# Patient Record
Sex: Male | Born: 1975 | Race: White | Hispanic: No | Marital: Single | Smoking: Current every day smoker
Health system: Southern US, Community
[De-identification: ages and names within clinical notes are randomized; demographics above are authoritative.]

## PROBLEM LIST (undated history)

## (undated) DIAGNOSIS — M1990 Unspecified osteoarthritis, unspecified site: Secondary | ICD-10-CM

## (undated) DIAGNOSIS — E119 Type 2 diabetes mellitus without complications: Secondary | ICD-10-CM

## (undated) HISTORY — DX: Type 2 diabetes mellitus without complications: E11.9

## (undated) HISTORY — DX: Unspecified osteoarthritis, unspecified site: M19.90

---

## 2005-07-31 ENCOUNTER — Emergency Department: Payer: Self-pay | Admitting: Unknown Physician Specialty

## 2009-11-07 ENCOUNTER — Ambulatory Visit: Payer: Self-pay | Admitting: Internal Medicine

## 2009-12-13 ENCOUNTER — Ambulatory Visit: Payer: Self-pay | Admitting: Family Medicine

## 2011-08-21 ENCOUNTER — Emergency Department: Payer: Self-pay | Admitting: Emergency Medicine

## 2011-08-21 LAB — COMPREHENSIVE METABOLIC PANEL
Albumin: 4.1 g/dL (ref 3.4–5.0)
Alkaline Phosphatase: 77 U/L (ref 50–136)
Anion Gap: 13 (ref 7–16)
Calcium, Total: 8.8 mg/dL (ref 8.5–10.1)
Chloride: 104 mmol/L (ref 98–107)
EGFR (African American): >60
EGFR (Non-African Amer.): >60
Glucose: 171 mg/dL — ABNORMAL HIGH (ref 65–99)
SGPT (ALT): 72 U/L

## 2011-08-21 LAB — CBC
MCHC: 34.7 g/dL (ref 32.0–36.0)
MCV: 91 fL (ref 80–100)
Platelet: 218 10*3/uL (ref 150–440)
RBC: 5.16 10*6/uL (ref 4.40–5.90)

## 2011-12-24 DIAGNOSIS — E119 Type 2 diabetes mellitus without complications: Secondary | ICD-10-CM | POA: Insufficient documentation

## 2013-05-27 DIAGNOSIS — E039 Hypothyroidism, unspecified: Secondary | ICD-10-CM | POA: Insufficient documentation

## 2013-09-16 DIAGNOSIS — L03116 Cellulitis of left lower limb: Secondary | ICD-10-CM | POA: Insufficient documentation

## 2013-09-18 ENCOUNTER — Observation Stay: Payer: Self-pay | Admitting: Internal Medicine

## 2013-09-18 LAB — CBC WITH DIFFERENTIAL/PLATELET
Basophil #: 0.1 10*3/uL (ref 0.0–0.1)
Basophil %: 0.6 %
Eosinophil #: 0.3 10*3/uL (ref 0.0–0.7)
Eosinophil %: 3.1 %
HCT: 46.4 % (ref 40.0–52.0)
HGB: 16.2 g/dL (ref 13.0–18.0)
LYMPHS PCT: 32 %
Lymphocyte #: 3.5 10*3/uL (ref 1.0–3.6)
MCH: 31.5 pg (ref 26.0–34.0)
MCHC: 34.8 g/dL (ref 32.0–36.0)
MCV: 90 fL (ref 80–100)
MONO ABS: 0.6 x10 3/mm (ref 0.2–1.0)
Monocyte %: 5.9 %
Neutrophil #: 6.4 10*3/uL (ref 1.4–6.5)
Neutrophil %: 58.4 %
Platelet: 257 10*3/uL (ref 150–440)
RBC: 5.14 10*6/uL (ref 4.40–5.90)
RDW: 13 % (ref 11.5–14.5)
WBC: 10.9 10*3/uL — ABNORMAL HIGH (ref 3.8–10.6)

## 2013-09-18 LAB — BASIC METABOLIC PANEL
Anion Gap: 9 (ref 7–16)
BUN: 17 mg/dL (ref 7–18)
CALCIUM: 9.5 mg/dL (ref 8.5–10.1)
CO2: 25 mmol/L (ref 21–32)
Chloride: 104 mmol/L (ref 98–107)
Creatinine: 0.95 mg/dL (ref 0.60–1.30)
EGFR (Non-African Amer.): >60
Glucose: 130 mg/dL — ABNORMAL HIGH (ref 65–99)
OSMOLALITY: 279 (ref 275–301)
POTASSIUM: 3.8 mmol/L (ref 3.5–5.1)
SODIUM: 138 mmol/L (ref 136–145)

## 2013-09-23 LAB — CULTURE, BLOOD (SINGLE)

## 2014-05-09 ENCOUNTER — Emergency Department: Payer: Self-pay | Admitting: Emergency Medicine

## 2014-05-09 LAB — CBC WITH DIFFERENTIAL/PLATELET
BASOS ABS: 0.1 10*3/uL (ref 0.0–0.1)
BASOS PCT: 0.7 %
EOS ABS: 0.3 10*3/uL (ref 0.0–0.7)
Eosinophil %: 2.5 %
HCT: 45.9 % (ref 40.0–52.0)
HGB: 16 g/dL (ref 13.0–18.0)
Lymphocyte #: 2.4 10*3/uL (ref 1.0–3.6)
Lymphocyte %: 18.8 %
MCH: 31.5 pg (ref 26.0–34.0)
MCHC: 34.8 g/dL (ref 32.0–36.0)
MCV: 91 fL (ref 80–100)
MONO ABS: 0.8 x10 3/mm (ref 0.2–1.0)
Monocyte %: 6.2 %
NEUTROS PCT: 71.8 %
Neutrophil #: 9.2 10*3/uL — ABNORMAL HIGH (ref 1.4–6.5)
PLATELETS: 211 10*3/uL (ref 150–440)
RBC: 5.07 10*6/uL (ref 4.40–5.90)
RDW: 12.4 % (ref 11.5–14.5)
WBC: 12.8 10*3/uL — AB (ref 3.8–10.6)

## 2014-05-09 LAB — COMPREHENSIVE METABOLIC PANEL
Albumin: 3.6 g/dL (ref 3.4–5.0)
Alkaline Phosphatase: 83 U/L
Anion Gap: 8 (ref 7–16)
BUN: 20 mg/dL — AB (ref 7–18)
Bilirubin,Total: 0.5 mg/dL (ref 0.2–1.0)
CO2: 24 mmol/L (ref 21–32)
CREATININE: 1.12 mg/dL (ref 0.60–1.30)
Calcium, Total: 8.3 mg/dL — ABNORMAL LOW (ref 8.5–10.1)
Chloride: 105 mmol/L (ref 98–107)
EGFR (African American): >60
EGFR (Non-African Amer.): >60
Glucose: 196 mg/dL — ABNORMAL HIGH (ref 65–99)
OSMOLALITY: 282 (ref 275–301)
Potassium: 4.1 mmol/L (ref 3.5–5.1)
SGOT(AST): 38 U/L — ABNORMAL HIGH (ref 15–37)
SGPT (ALT): 51 U/L
Sodium: 137 mmol/L (ref 136–145)
Total Protein: 7.6 g/dL (ref 6.4–8.2)

## 2014-05-15 LAB — CULTURE, BLOOD (SINGLE)

## 2014-09-11 NOTE — H&P (Signed)
PATIENT NAME:  Thomas Benton, Thomas Benton DATE OF BIRTH:  December 31, 1975  DATE OF ADMISSION:  09/18/2013  PRIMARY CARE PHYSICIAN:  Dr. Delton PrairiePaul Benton.   REFERRING PHYSICIAN:  Dr. Chiquita LothJade Benton.   CHIEF COMPLAINT:  Left third toe ulcer, pain, drainage.   HISTORY OF PRESENT ILLNESS:  Mr. Thomas Benton is a 39 year old morbidly obese white male with a BMI of 42, has history of diabetes mellitus, on oral medication with metformin who comes to the Emergency Department for two days of pain and swelling in the third left toe.  Denies having any trauma.  Denies wearing tight shoes.  Two days back noticed to have some fever of 100.8; however, did not have any fever in the last two days.  Also noticed to have some drainage.  Concerning about worsening of the pain came to the Emergency Department.  X-ray of the left foot does not show any osteomyelitis.  CBC shows mild elevation of the WBC, but no left shift.  Blood glucose is 130.  The patient states usually blood sugars run around over 200.   PAST MEDICAL HISTORY: 1.  Hypothyroidism.  2.  Diabetes mellitus.   ALLERGIES:  No known drug allergies.   HOME MEDICATIONS: 1.  Zofran 4 mg every 4 to 6 hours as needed.  2.  Synthroid 175 mcg once a day.  3.  Metformin 750 mg once a day.  4.  Antivert 25 mg 4 to 6 hours as needed.   SOCIAL HISTORY:  Smokes 1 pack a day.  Denies drinking alcohol or using illicit drugs.  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   FAMILY HISTORY:  Diabetes mellitus.   REVIEW OF SYSTEMS:  CONSTITUTIONAL:  Denies any generalized weakness.  EYES:  No change in vision.  EARS, NOSE, THROAT:  No change in hearing.  RESPIRATORY:  No cough, shortness of breath.   CARDIOVASCULAR:  No chest pain, palpitations.  GASTROINTESTINAL:  No nausea, vomiting, abdominal pain.  GENITOURINARY:  No dysuria or hematuria.   HEMATOLOGY:  No easy bruising or bleeding.  ENDOCRINE:  No polyuria or polydipsia; however, the patient has diabetes mellitus and  hypothyroidism.  MUSCULOSKELETAL:  No joint pains and aches.  NEUROLOGIC:  No weakness or numbness in any part of the body.   PHYSICAL EXAMINATION: GENERAL:  This is a well-built, well-nourished morbidly obese male down in the bed, not in distress.  VITAL SIGNS:  Temperature 98, pulse 68, blood pressure 130/68, respiratory rate of 16, oxygen saturation 96% on room air.  HEENT:  Head normocephalic, atraumatic.  Eyes, no scleral icterus.  Conjunctivae normal.  Pupils equal and react to light.  Extraocular movements are intact.  Mucous membranes moist.  No pharyngeal erythema.  NECK:  Supple.  No lymphadenopathy.  No JVD.  No carotid bruit.  CHEST:  Has no focal tenderness.  LUNGS:  Bilaterally clear to auscultation.  HEART:  S1, S2 regular.  No murmurs are heard.  ABDOMEN:  Bowel sounds plus.  Soft, nontender, nondistended.  No hepatosplenomegaly. EXTREMITIES:  Left third foot has significant redness and swelling of the left third toe.  Tenderness to palpation.  Pulses 2+.   MUSCULOSKELETAL:  Good range of motion in all the extremities.  SKIN:  No rash or lesions.  NEUROLOGIC:  The patient is alert, oriented to place, person, and time.  Cranial nerves II through XII intact.  Motor 5 by 5 in upper and lower extremities.   LABORATORY DATA:  CBC is completely within normal limits.  CMP is completely within normal limits.  X-ray of the left foot shows no evidence of osseous erosion to suggest osteomyelitis, soft tissue swelling.   ASSESSMENT AND PLAN:  Mr. Thomas Benton is a 39 year old male who comes to the Emergency Department left third toe wound.  1.  Left foot diabetic ulcer.  We will obtain MRA of the foot.  If positive for osteomyelitis we will consult orthopedic surgery for bone biopsy and cultures.  We will hold the antibiotics for now as the patient does not have any fever or elevated white blood cell count.  2.  Diabetes mellitus, poorly controlled.  We will obtain hemoglobin A1c.  We will hold  the metformin for now.  Continue with sliding scale insulin.  3.  Morbid obesity.  Counseled with the patient.  4.  Tobacco use:  Counseled with the patient.  If the patient is anxious we will consider starting on nicotine patch.   5.  Hypothyroidism.  Continue Synthroid.  6.  Keep the patient on deep vein thrombosis prophylaxis with Lovenox.   TIME SPENT:  45 minutes.    ____________________________ Thomas Griffins, MD pv:ea D: 09/18/2013 02:25:36 ET T: 09/18/2013 03:27:57 ET JOB#: [id number]  cc: Thomas Griffins, MD, <Dictator> Vic Ripper. Mariana Kaufman, MD Clerance Lav Quintel Mccalla MD ELECTRONICALLY SIGNED 09/19/2013 1:03

## 2014-09-11 NOTE — Discharge Summary (Signed)
PATIENT NAME:  Thomas Benton, Thomas MR#:  [id number] DATE OF BIRTH:  1976/01/24  DATE OF ADMISSION:  09/18/2013 DATE OF DISCHARGE:  09/18/2013  ADMITTING DIAGNOSIS:  Left third toe pain, erythema, some drainage.   DISCHARGE DIAGNOSES:  1.  Left third toe ulcer with some pain and drainage due to acute cellulitis without evidence of abscess or osteomyelitis.  2.  Hypothyroidism.  3.  Diabetes with poor control.  4.  Nicotine addiction.   CONSULTANTS:  None.   PERTINENT LABORATORIES AND EVALUATIONS:  MRI of the left foot showed negative for osteomyelitis, cellulitis of the third toe, no soft tissue abscess. Blood culture shows no growth x2. Glucose 130, BUN 17, creatinine 0.95, sodium 138, potassium 3.8, chloride 104, CO2 is 25, calcium 8.5. WBC 10.9, hemoglobin 16.2, platelet count was 257.   HOSPITAL COURSE:  Please refer to H and P done by the admitting physician. The patient is a 39 year old white male with history of poorly controlled diabetes, who presented with complaint of left third toe pain and drainage. The patient came to the ED, and he had a slightly elevated WBC count. There was concern that he may have osteomyelitis. He underwent an MRI of the foot, which was negative for any evidence of abscess or osteomyelitis. It was likely due to cellulitis. By the time I went to see him, he was very anxious to go home. At this time, I will discharge him with outpatient followup with podiatry.   DISCHARGE MEDICATIONS:  Synthroid 175 mcg daily, metformin 750 daily, aspirin 81 one tab p.o. daily, Bactrim 1 tab p.o. b.i.d. for 7 days, acetaminophen/oxycodone 325/5 one tab p.o. q.8 p.r.n. for pain, promethazine (Dictation Anomaly) p.r.n. for nausea.   DIET:  Low fat, low cholesterol, carbohydrate controlled diet.   ACTIVITY:  As tolerated.   FOLLOWUP:  With primary M.D. in 1 to 2 weeks. [facility] 1 to 2 weeks.  (Dictation Anomaly)  TIME SPENT ON THIS DISCHARGE:  45 minutes.     ____________________________ Lacie ScottsShreyang H. Allena KatzPatel, MD shp:ms D: 09/18/2013 18:39:55 ET T: 09/18/2013 20:37:24 ET JOB#: [id number]  cc: Ram Haugan H. Allena KatzPatel, MD, <Dictator> Charise CarwinSHREYANG H Jeronda Don MD ELECTRONICALLY SIGNED 09/23/2013 16:16

## 2018-03-17 DIAGNOSIS — M1711 Unilateral primary osteoarthritis, right knee: Secondary | ICD-10-CM | POA: Insufficient documentation

## 2018-03-17 DIAGNOSIS — Z6841 Body Mass Index (BMI) 40.0 and over, adult: Secondary | ICD-10-CM

## 2018-04-16 ENCOUNTER — Encounter: Payer: Self-pay | Admitting: Nurse Practitioner

## 2018-04-16 ENCOUNTER — Ambulatory Visit
Admission: RE | Admit: 2018-04-16 | Discharge: 2018-04-16 | Disposition: A | Discharge status: Home / Self Care | Payer: BLUE CROSS/BLUE SHIELD | Source: Ambulatory Visit | Attending: Nurse Practitioner | Admitting: Nurse Practitioner

## 2018-04-16 ENCOUNTER — Ambulatory Visit: Payer: BLUE CROSS/BLUE SHIELD | Admitting: Nurse Practitioner

## 2018-04-16 ENCOUNTER — Other Ambulatory Visit: Payer: Self-pay

## 2018-04-16 VITALS — BP 132/76 | HR 87 | Temp 98.6°F | Ht 68.0 in | Wt 285.0 lb

## 2018-04-16 DIAGNOSIS — G8929 Other chronic pain: Secondary | ICD-10-CM

## 2018-04-16 DIAGNOSIS — M79641 Pain in right hand: Secondary | ICD-10-CM

## 2018-04-16 DIAGNOSIS — M25511 Pain in right shoulder: Principal | ICD-10-CM

## 2018-04-16 DIAGNOSIS — Z7984 Long term (current) use of oral hypoglycemic drugs: Secondary | ICD-10-CM | POA: Insufficient documentation

## 2018-04-16 DIAGNOSIS — M25561 Pain in right knee: Secondary | ICD-10-CM

## 2018-04-16 DIAGNOSIS — Z789 Other specified health status: Secondary | ICD-10-CM | POA: Insufficient documentation

## 2018-04-16 DIAGNOSIS — E039 Hypothyroidism, unspecified: Secondary | ICD-10-CM | POA: Insufficient documentation

## 2018-04-16 DIAGNOSIS — M79605 Pain in left leg: Secondary | ICD-10-CM

## 2018-04-16 DIAGNOSIS — M899 Disorder of bone, unspecified: Secondary | ICD-10-CM

## 2018-04-16 DIAGNOSIS — M1711 Unilateral primary osteoarthritis, right knee: Secondary | ICD-10-CM | POA: Insufficient documentation

## 2018-04-16 DIAGNOSIS — Z6841 Body Mass Index (BMI) 40.0 and over, adult: Secondary | ICD-10-CM

## 2018-04-16 DIAGNOSIS — G894 Chronic pain syndrome: Secondary | ICD-10-CM | POA: Insufficient documentation

## 2018-04-16 DIAGNOSIS — F17210 Nicotine dependence, cigarettes, uncomplicated: Secondary | ICD-10-CM | POA: Insufficient documentation

## 2018-04-16 DIAGNOSIS — M79604 Pain in right leg: Secondary | ICD-10-CM | POA: Insufficient documentation

## 2018-04-16 DIAGNOSIS — E1140 Type 2 diabetes mellitus with diabetic neuropathy, unspecified: Secondary | ICD-10-CM | POA: Insufficient documentation

## 2018-04-16 DIAGNOSIS — Z79899 Other long term (current) drug therapy: Secondary | ICD-10-CM

## 2018-04-16 NOTE — Patient Instructions (Addendum)
____________________________________________________________________________________________  Appointment Policy Summary  It is our goal and responsibility to provide the medical community with assistance in the evaluation and management of patients with chronic pain. Unfortunately our resources are limited. Because we do not have an unlimited amount of time, or available appointments, we are required to closely monitor and manage their use. The following rules exist to maximize their use:  Patient's responsibilities: 1. Punctuality:  At what time should I arrive? You should be physically present in our office 30 minutes before your scheduled appointment. Your scheduled appointment is with your assigned healthcare provider. However, it takes 5-10 minutes to be "checked-in", and another 15 minutes for the nurses to do the admission. If you arrive to our office at the time you were given for your appointment, you will end up being at least 20-25 minutes late to your appointment with the provider. 2. Tardiness:  What happens if I arrive only a few minutes after my scheduled appointment time? You will need to reschedule your appointment. The cutoff is your appointment time. This is why it is so important that you arrive at least 30 minutes before that appointment. If you have an appointment scheduled for 10:00 AM and you arrive at 10:01, you will be required to reschedule your appointment.  3. Plan ahead:  Always assume that you will encounter traffic on your way in. Plan for it. If you are dependent on a driver, make sure they understand these rules and the need to arrive early. 4. Other appointments and responsibilities:  Avoid scheduling any other appointments before or after your pain clinic appointments.  5. Be prepared:  Write down everything that you need to discuss with your healthcare provider and give this information to the admitting nurse. Write down the medications that you will need  refilled. Bring your pills and bottles (even the empty ones), to all of your appointments, except for those where a procedure is scheduled. 6. No children or pets:  Find someone to take care of them. It is not appropriate to bring them in. 7. Scheduling changes:  We request "advanced notification" of any changes or cancellations. 8. Advanced notification:  Defined as a time period of more than 24 hours prior to the originally scheduled appointment. This allows for the appointment to be offered to other patients. 9. Rescheduling:  When a visit is rescheduled, it will require the cancellation of the original appointment. For this reason they both fall within the category of "Cancellations".  10. Cancellations:  They require advanced notification. Any cancellation less than 24 hours before the  appointment will be recorded as a "No Show". 11. No Show:  Defined as an unkept appointment where the patient failed to notify or declare to the practice their intention or inability to keep the appointment.  Corrective process for repeat offenders:  1. Tardiness: Three (3) episodes of rescheduling due to late arrivals will be recorded as one (1) "No Show". 2. Cancellation or reschedule: Three (3) cancellations or rescheduling will be recorded as one (1) "No Show". 3. "No Shows": Three (3) "No Shows" within a 12 month period will result in discharge from the practice. ____________________________________________________________________________________________   ______________________________________________________________________________________________  Specialty Pain Scale  Introduction:  There are significant differences in how pain is reported. The word pain usually refers to physical pain, but it is also a common synonym of suffering. The medical community uses a scale from 0 (zero) to 10 (ten) to report pain level. Zero (0) is described as "no pain",   while ten (10) is described as "the worse pain  you can imagine". The problem with this scale is that physical pain is reported along with suffering. Suffering refers to mental pain, or more often yet it refers to any unpleasant feeling, emotion or aversion associated with the perception of harm or threat of harm. It is the psychological component of pain.  Pain Specialists prefer to separate the two components. The pain scale used by this practice is the Verbal Numerical Rating Scale (VNRS-11). This scale is for the physical pain only. DO NOT INCLUDE how your pain psychologically affects you. This scale is for adults 21 years of age and older. It has 11 (eleven) levels. The 1st level is 0/10. This means: "right now, I have no pain". In the context of pain management, it also means: "right now, my physical pain is under control with the current therapy".  General Information:  The scale should reflect your current level of pain. Unless you are specifically asked for the level of your worst pain, or your average pain. If you are asked for one of these two, then it should be understood that it is over the past 24 hours.  Levels 1 (one) through 5 (five) are described below, and can be treated as an outpatient. Ambulatory pain management facilities such as ours are more than adequate to treat these levels. Levels 6 (six) through 10 (ten) are also described below, however, these must be treated as a hospitalized patient. While levels 6 (six) and 7 (seven) may be evaluated at an urgent care facility, levels 8 (eight) through 10 (ten) constitute medical emergencies and as such, they belong in a hospital's emergency department. When having these levels (as described below), do not come to our office. Our facility is not equipped to manage these levels. Go directly to an urgent care facility or an emergency department to be evaluated.  Definitions:  Activities of Daily Living (ADL): Activities of daily living (ADL or ADLs) is a term used in healthcare to refer to  people's daily self-care activities. Health professionals often use a person's ability or inability to perform ADLs as a measurement of their functional status, particularly in regard to people post injury, with disabilities and the elderly. There are two ADL levels: Basic and Instrumental. Basic Activities of Daily Living (BADL  or BADLs) consist of self-care tasks that include: Bathing and showering; personal hygiene and grooming (including brushing/combing/styling hair); dressing; Toilet hygiene (getting to the toilet, cleaning oneself, and getting back up); eating and self-feeding (not including cooking or chewing and swallowing); functional mobility, often referred to as "transferring", as measured by the ability to walk, get in and out of bed, and get into and out of a chair; the broader definition (moving from one place to another while performing activities) is useful for people with different physical abilities who are still able to get around independently. Basic ADLs include the things many people do when they get up in the morning and get ready to go out of the house: get out of bed, go to the toilet, bathe, dress, groom, and eat. On the average, loss of function typically follows a particular order. Hygiene is the first to go, followed by loss of toilet use and locomotion. The last to go is the ability to eat. When there is only one remaining area in which the person is independent, there is a 62.9% chance that it is eating and only a 3.5% chance that it is hygiene. Instrumental Activities   of Daily Living (IADL or IADLs) are not necessary for fundamental functioning, but they let an individual live independently in a community. IADL consist of tasks that include: cleaning and maintaining the house; home establishment and maintenance; care of others (including selecting and supervising caregivers); care of pets; child rearing; managing money; managing financials (investments, etc.); meal preparation  and cleanup; shopping for groceries and necessities; moving within the community; safety procedures and emergency responses; health management and maintenance (taking prescribed medications); and using the telephone or other form of communication.  Instructions:  Most patients tend to report their pain as a combination of two factors, their physical pain and their psychosocial pain. This last one is also known as "suffering" and it is reflection of how physical pain affects you socially and psychologically. From now on, report them separately.  From this point on, when asked to report your pain level, report only your physical pain. Use the following table for reference.  Pain Clinic Pain Levels (0-5/10)  Pain Level Score  Description  No Pain 0   Mild pain 1 Nagging, annoying, but does not interfere with basic activities of daily living (ADL). Patients are able to eat, bathe, get dressed, toileting (being able to get on and off the toilet and perform personal hygiene functions), transfer (move in and out of bed or a chair without assistance), and maintain continence (able to control bladder and bowel functions). Blood pressure and heart rate are unaffected. A normal heart rate for a healthy adult ranges from 60 to 100 bpm (beats per minute).   Mild to moderate pain 2 Noticeable and distracting. Impossible to hide from other people. More frequent flare-ups. Still possible to adapt and function close to normal. It can be very annoying and may have occasional stronger flare-ups. With discipline, patients may get used to it and adapt.   Moderate pain 3 Interferes significantly with activities of daily living (ADL). It becomes difficult to feed, bathe, get dressed, get on and off the toilet or to perform personal hygiene functions. Difficult to get in and out of bed or a chair without assistance. Very distracting. With effort, it can be ignored when deeply involved in activities.   Moderately severe pain  4 Impossible to ignore for more than a few minutes. With effort, patients may still be able to manage work or participate in some social activities. Very difficult to concentrate. Signs of autonomic nervous system discharge are evident: dilated pupils (mydriasis); mild sweating (diaphoresis); sleep interference. Heart rate becomes elevated (>115 bpm). Diastolic blood pressure (lower number) rises above 100 mmHg. Patients find relief in laying down and not moving.   Severe pain 5 Intense and extremely unpleasant. Associated with frowning face and frequent crying. Pain overwhelms the senses.  Ability to do any activity or maintain social relationships becomes significantly limited. Conversation becomes difficult. Pacing back and forth is common, as getting into a comfortable position is nearly impossible. Pain wakes you up from deep sleep. Physical signs will be obvious: pupillary dilation; increased sweating; goosebumps; brisk reflexes; cold, clammy hands and feet; nausea, vomiting or dry heaves; loss of appetite; significant sleep disturbance with inability to fall asleep or to remain asleep. When persistent, significant weight loss is observed due to the complete loss of appetite and sleep deprivation.  Blood pressure and heart rate becomes significantly elevated. Caution: If elevated blood pressure triggers a pounding headache associated with blurred vision, then the patient should immediately seek attention at an urgent or emergency care unit, as   these may be signs of an impending stroke.    Emergency Department Pain Levels (6-10/10)  Emergency Room Pain 6 Severely limiting. Requires emergency care and should not be seen or managed at an outpatient pain management facility. Communication becomes difficult and requires great effort. Assistance to reach the emergency department may be required. Facial flushing and profuse sweating along with potentially dangerous increases in heart rate and blood pressure  will be evident.   Distressing pain 7 Self-care is very difficult. Assistance is required to transport, or use restroom. Assistance to reach the emergency department will be required. Tasks requiring coordination, such as bathing and getting dressed become very difficult.   Disabling pain 8 Self-care is no longer possible. At this level, pain is disabling. The individual is unable to do even the most "basic" activities such as walking, eating, bathing, dressing, transferring to a bed, or toileting. Fine motor skills are lost. It is difficult to think clearly.   Incapacitating pain 9 Pain becomes incapacitating. Thought processing is no longer possible. Difficult to remember your own name. Control of movement and coordination are lost.   The worst pain imaginable 10 At this level, most patients pass out from pain. When this level is reached, collapse of the autonomic nervous system occurs, leading to a sudden drop in blood pressure and heart rate. This in turn results in a temporary and dramatic drop in blood flow to the brain, leading to a loss of consciousness. Fainting is one of the body's self defense mechanisms. Passing out puts the brain in a calmed state and causes it to shut down for a while, in order to begin the healing process.    Summary: 1. Refer to this scale when providing us with your pain level. 2. Be accurate and careful when reporting your pain level. This will help with your care. 3. Over-reporting your pain level will lead to loss of credibility. 4. Even a level of 1/10 means that there is pain and will be treated at our facility. 5. High, inaccurate reporting will be documented as "Symptom Exaggeration", leading to loss of credibility and suspicions of possible secondary gains such as obtaining more narcotics, or wanting to appear disabled, for fraudulent reasons. 6. Only pain levels of 5 or below will be seen at our facility. 7. Pain levels of 6 and above will be sent to the  Emergency Department and the appointment cancelled. ______________________________________________________________________________________________   BMI Assessment: Estimated body mass index is 43.33 kg/m as calculated from the following:   Height as of this encounter: 5\' 8"  (1.727 m).   Weight as of this encounter: 285 lb (129.3 kg).  BMI interpretation table: BMI level Category Range association with higher incidence of chronic pain  <18 kg/m2 Underweight   18.5-24.9 kg/m2 Ideal body weight   25-29.9 kg/m2 Overweight Increased incidence by 20%  30-34.9 kg/m2 Obese (Class I) Increased incidence by 68%  35-39.9 kg/m2 Severe obesity (Class II) Increased incidence by 136%  >40 kg/m2 Extreme obesity (Class III) Increased incidence by 254%   BMI Readings from Last 4 Encounters:  04/16/18 43.33 kg/m   Wt Readings from Last 4 Encounters:  04/16/18 285 lb (129.3 kg)

## 2018-04-16 NOTE — Progress Notes (Addendum)
Patient's Name: Thomas Benton  MRN: [id number]  Referring Provider: Sofie Hartigan, MD  DOB: 09-04-1975  PCP: Sofie Hartigan, MD  DOS: 04/16/2018  Note by: Dionisio David NP  Service setting: Ambulatory outpatient  Specialty: Interventional Pain Management  Location: [facility] (AMB) Pain Management Facility    Patient type: New Patient    Primary Reason(s) for Visit: Initial Patient Evaluation CC: Leg Pain  HPI  Thomas Benton is a 42 y.o. year old, male patient, who comes today for an initial evaluation. He has Cellulitis of foot, left; Diabetic neuropathy, type II diabetes mellitus ([place]); Hypothyroidism (acquired); Morbid obesity with BMI of 40.0-44.9, adult ([place]); Primary osteoarthritis of right knee; Type 2 diabetes mellitus without complications ([place]); Chronic pain of both lower extremities (Primary Area of Pain) (R>L); Chronic pain of right knee (Secondary Area of Pain); Chronic hand pain, right [facility] Area of Pain); Chronic right shoulder pain; Chronic pain syndrome; Pharmacologic therapy; Disorder of skeletal system; and Problems influencing health status on their problem list.. His primarily concern today is the Leg Pain  Pain Assessment: Location: Right, Left Hand(knee, hands, feet, leg) Radiating: pain radiaties down legs to feet Onset: More than a month ago Duration: Chronic pain Quality: Tingling, Throbbing, Aching, Pins and needles Severity: 9 /10 (subjective, self-reported pain score)  Note: Reported level is compatible with observation. Clinically the patient looks like a 1/10 A 1/10 is viewed as "Mild" and described as nagging, annoying, but not interfering with basic activities of daily living (ADL). Thomas Benton is able to eat, bathe, get dressed, do toileting (being able to get on and off the toilet and perform personal hygiene functions), transfer (move in and out of bed or a chair without assistance), and maintain continence (able to control bladder and bowel functions).  Physiologic parameters such as blood pressure and heart rate apear wnl. Information on the proper use of the pain scale provided to the patient today. When using our objective Pain Scale, levels between 6 and 10/10 are said to belong in an emergency room, as it progressively worsens from a 6/10, described as severely limiting, requiring emergency care not usually available at an outpatient pain management facility. At a 6/10 level, communication becomes difficult and requires great effort. Assistance to reach the emergency department may be required. Facial flushing and profuse sweating along with potentially dangerous increases in heart rate and blood pressure will be evident. Effect on ADL: pain is effecting my job and my whole life Timing: Constant Modifying factors: nothings BP: 132/76  HR: 87  Onset and Duration: Date of onset: 10 years ago Cause of pain: Unknown Severity: Getting better, NAS-11 at its worse: 10/10, NAS-11 at its best: 8/10, NAS-11 now: 9/10 and NAS-11 on the average: 9/10 Timing: Not influenced by the time of the day, During activity or exercise, After activity or exercise and After a period of immobility Aggravating Factors: Bending, Climbing, Intercourse (sex), Kneeling, Lifiting, Motion, Prolonged sitting, Prolonged standing, Squatting, Stooping , Twisting, Walking, Walking uphill, Walking downhill and Working Alleviating Factors: Medications Associated Problems: Fatigue, Swelling, Pain that wakes patient up and Pain that does not allow patient to sleep Quality of Pain: Cruel, Deep, Disabling, Dreadful, Exhausting, Fearful, Heavy, Horrible, Pulsating, Sharp, Shooting, Splitting, Stabbing, Throbbing, Tingling and Toothache-like Previous Examinations or Tests: X-rays and Orthopedic evaluation Previous Treatments: Narcotic medications  The patient comes into the clinics today for the first time for a chronic pain management evaluation.  According to the patient his primary  area of pain is  in his legs.  He admits that they are about equal.  He denies any injury.  He admits the pain has been going on for approximately 10 years.  He feels like this is related to peripheral neuropathy from diabetes.  He has some numbness and tingling.  He denies weakness.  He admits that he has failed Lyrica tramadol and gabapentin.  He denies previous nerve conduction study.  His second area pain is in his right knee.  He admits this was from an old injury where he fell off a ladder.  He denies any previous surgery, interventional therapy or physical therapy.  He admits that he did have recent images done at [facility] in [place].  He has been prescribed a brace and is currently wearing this.  His third area pain is in his right hand.  He denies any previous injury.  The pain affects fingers 3, 4 and 5 the outside of his hand.  He describes it as a aching numbness feeling.  He admits that he does have some weakness with grabbing.  Today I took the time to provide the patient with information regarding this pain practice. The patient was informed that the practice is divided into two sections: an interventional pain management section, as well as a completely separate and distinct medication management section. I explained that there are procedure days for interventional therapies, and evaluation days for follow-ups and medication management. Because of the amount of documentation required during both, they are kept separated. This means that there is the possibility that he may be scheduled for a procedure on one day, and medication management the next. I have also informed him that because of staffing and facility limitations, this practice will no longer take patients for medication management only. To illustrate the reasons for this, I gave the patient the example of surgeons, and how inappropriate it would be to refer a patient to his/her care, just to write for the post-surgical  antibiotics on a surgery done by a different surgeon.   Because interventional pain management is part of the board-certified specialty for the doctors, the patient was informed that joining this practice means that they are open to any and all interventional therapies. I made it clear that this does not mean that they will be forced to have any procedures done. What this means is that I believe interventional therapies to be essential part of the diagnosis and proper management of chronic pain conditions. Therefore, patients not interested in these interventional alternatives will be better served under the care of a different practitioner.  The patient was also made aware of my Comprehensive Pain Management Safety Guidelines where by joining this practice, they limit all of their nerve blocks and joint injections to those done by our practice, for as long as we are retained to manage their care. Historic Controlled Substance Pharmacotherapy Review  PMP and historical list of controlled substances: Lyrica 50 mg, hydrocodone codon Chlorphen ER suspension, oxycodone/acetaminophen 5/325 mg PMP questionable Admits Tramadol use no inidcation Highest opioid analgesic regimen found: Oxycodone/acetaminophen 5/325 1 tablet 4 times daily (fill date 05/10/2014) oxycodone 20 mg/day Most recent opioid analgesic: none Current opioid analgesics: None Highest recorded MME/day: 30 mg/day MME/day: 0 mg/day Medications: The patient did not bring the medication(s) to the appointment, as requested in our "New Patient Package" Pharmacodynamics: Desired effects: Analgesia: The patient reports >50% benefit. Reported improvement in function: The patient reports medication allows him to accomplish basic ADLs. Clinically meaningful improvement in function (  CMIF): Sustained CMIF goals met Perceived effectiveness: Described as relatively effective, allowing for increase in activities of daily living (ADL) Undesirable  effects: Side-effects or Adverse reactions: None reported Historical Monitoring: The patient  has no drug history on file. List of all UDS Test(s): No results found for: MDMA, COCAINSCRNUR, PCPSCRNUR, PCPQUANT, CANNABQUANT, THCU, [place] List of all Serum Drug Screening Test(s):  No results found for: AMPHSCRSER, BARBSCRSER, BENZOSCRSER, COCAINSCRSER, PCPSCRSER, PCPQUANT, THCSCRSER, CANNABQUANT, OPIATESCRSER, OXYSCRSER, PROPOXSCRSER Historical Background Evaluation: [place] PDMP: Six (6) year initial data search conducted.              Department of public safety, offender search: Editor, commissioning Information) Non-contributory Risk Assessment Profile: Aberrant behavior: None observed or detected today Risk factors for fatal opioid overdose: age 38-11 years old, caucasian and male gender Fatal overdose hazard ratio (HR): Calculation deferred Non-fatal overdose hazard ratio (HR): Calculation deferred Risk of opioid abuse or dependence: 0.7-3.0% with doses ? 36 MME/day and 6.1-26% with doses ? 120 MME/day. Substance use disorder (SUD) risk level: Pending results of Medical Psychology Evaluation for SUD Opioid risk tool (ORT) (Total Score): 1  ORT Scoring interpretation table:  Score <3 = Low Risk for SUD  Score between 4-7 = Moderate Risk for SUD  Score >8 = High Risk for Opioid Abuse   PHQ-2 Depression Scale:  Total score:    PHQ-2 Scoring interpretation table: (Score and probability of major depressive disorder)  Score 0 = No depression  Score 1 = 15.4% Probability  Score 2 = 21.1% Probability  Score 3 = 38.4% Probability  Score 4 = 45.5% Probability  Score 5 = 56.4% Probability  Score 6 = 78.6% Probability   PHQ-9 Depression Scale:  Total score:    PHQ-9 Scoring interpretation table:  Score 0-4 = No depression  Score 5-9 = Mild depression  Score 10-14 = Moderate depression  Score 15-19 = Moderately severe depression  Score 20-27 = Severe depression (2.4 times higher risk of SUD and 2.89  times higher risk of overuse)   Pharmacologic Plan: Pending ordered tests and/or consults  Meds  The patient has a current medication list which includes the following prescription(s): metformin.  Current Outpatient Medications on File Prior to Visit  Medication Sig  . metFORMIN (GLUCOPHAGE) 500 MG tablet Take by mouth 2 (two) times daily with a meal.   No current facility-administered medications on file prior to visit.    Imaging Review  Note: No new results found.        ROS  Cardiovascular History: No reported cardiovascular signs or symptoms such as High blood pressure, coronary artery disease, abnormal heart rate or rhythm, heart attack, blood thinner therapy or heart weakness and/or failure Pulmonary or Respiratory History: Smoking Neurological History: No reported neurological signs or symptoms such as seizures, abnormal skin sensations, urinary and/or fecal incontinence, being born with an abnormal open spine and/or a tethered spinal cord Review of Past Neurological Studies: No results found for this or any previous visit. Psychological-Psychiatric History: No reported psychological or psychiatric signs or symptoms such as difficulty sleeping, anxiety, depression, delusions or hallucinations (schizophrenial), mood swings (bipolar disorders) or suicidal ideations or attempts Gastrointestinal History: No reported gastrointestinal signs or symptoms such as vomiting or evacuating blood, reflux, heartburn, alternating episodes of diarrhea and constipation, inflamed or scarred liver, or pancreas or irrregular and/or infrequent bowel movements Genitourinary History: No reported renal or genitourinary signs or symptoms such as difficulty voiding or producing urine, peeing blood, non-functioning kidney, kidney stones, difficulty emptying the  bladder, difficulty controlling the flow of urine, or chronic kidney disease Hematological History: No reported hematological signs or symptoms such as  prolonged bleeding, low or poor functioning platelets, bruising or bleeding easily, hereditary bleeding problems, low energy levels due to low hemoglobin or being anemic Endocrine History: High blood sugar requiring insulin (IDDM) Rheumatologic History: No reported rheumatological signs and symptoms such as fatigue, joint pain, tenderness, swelling, redness, heat, stiffness, decreased range of motion, with or without associated rash Musculoskeletal History: Negative for myasthenia gravis, muscular dystrophy, multiple sclerosis or malignant hyperthermia Work History: Working full time  Allergies  Thomas Benton has no allergies on file.  Laboratory Chemistry  Inflammation Markers No results found for: CRP, ESRSEDRATE (CRP: Acute Phase) (ESR: Chronic Phase) Renal Function Markers Lab Results  Component Value Date   BUN 20 (H) 05/09/2014   CREATININE 1.12 05/09/2014   GFRAA >60 05/09/2014   GFRNONAA >60 05/09/2014   Hepatic Function Markers Lab Results  Component Value Date   AST 38 (H) 05/09/2014   ALT 51 05/09/2014   ALBUMIN 3.6 05/09/2014   ALKPHOS 83 05/09/2014   Electrolytes Lab Results  Component Value Date   NA 137 05/09/2014   K 4.1 05/09/2014   CL 105 05/09/2014   CALCIUM 8.3 (L) 05/09/2014   Neuropathy Markers No results found for: [id number] Bone Pathology Markers Lab Results  Component Value Date   ALKPHOS 83 05/09/2014   CALCIUM 8.3 (L) 05/09/2014   Coagulation Parameters Lab Results  Component Value Date   PLT 211 05/09/2014   Cardiovascular Markers Lab Results  Component Value Date   HGB 16.0 05/09/2014   HCT 45.9 05/09/2014   Note: Lab results reviewed.  PFSH  Drug: Thomas Benton  has no drug history on file. Alcohol:  has no alcohol history on file. Tobacco:  reports that he has been smoking. He has been smoking about 0.50 packs per day. He uses smokeless tobacco. Medical:  has a past medical history of Arthritis and Diabetes mellitus without  complication ([place]). Family: family history is not on file.  History reviewed. No pertinent surgical history. Active Ambulatory Problems    Diagnosis Date Noted  . Cellulitis of foot, left 09/16/2013  . Diabetic neuropathy, type II diabetes mellitus ([place]) 04/16/2018  . Hypothyroidism (acquired) 05/27/2013  . Morbid obesity with BMI of 40.0-44.9, adult ([place]) 03/17/2018  . Primary osteoarthritis of right knee 03/17/2018  . Type 2 diabetes mellitus without complications ([place]) 76/54/6503  . Chronic pain of both lower extremities (Primary Area of Pain) (R>L) 04/16/2018  . Chronic pain of right knee (Secondary Area of Pain) 04/16/2018  . Chronic hand pain, right [facility] Area of Pain) 04/16/2018  . Chronic right shoulder pain 04/16/2018  . Chronic pain syndrome 04/16/2018  . Pharmacologic therapy 04/16/2018  . Disorder of skeletal system 04/16/2018  . Problems influencing health status 04/16/2018   Resolved Ambulatory Problems    Diagnosis Date Noted  . No Resolved Ambulatory Problems   Past Medical History:  Diagnosis Date  . Arthritis   . Diabetes mellitus without complication ([place])    Constitutional Exam  General appearance: Well nourished, well developed, and well hydrated. In no apparent acute distress Vitals:   04/16/18 0956  BP: 132/76  Pulse: 87  Temp: 98.6 F (37 C)  SpO2: 99%  Weight: 285 lb (129.3 kg)  Height: '5\' 8"'  (1.727 m)   BMI Assessment: Estimated body mass index is 43.33 kg/m as calculated from the following:   Height as of this  encounter: '5\' 8"'  (1.727 m).   Weight as of this encounter: 285 lb (129.3 kg).  BMI interpretation table: BMI level Category Range association with higher incidence of chronic pain  <18 kg/m2 Underweight   18.5-24.9 kg/m2 Ideal body weight   25-29.9 kg/m2 Overweight Increased incidence by 20%  30-34.9 kg/m2 Obese (Class I) Increased incidence by 68%  35-39.9 kg/m2 Severe obesity (Class II) Increased incidence by 136%  >40 kg/m2  Extreme obesity (Class III) Increased incidence by 254%   BMI Readings from Last 4 Encounters:  04/16/18 43.33 kg/m   Wt Readings from Last 4 Encounters:  04/16/18 285 lb (129.3 kg)  Psych/Mental status: Alert, oriented x 3 (person, place, & time)       Eyes: PERLA Respiratory: No evidence of acute respiratory distress  Cervical Spine Exam  Inspection: No masses, redness, or swelling Alignment: Symmetrical Functional ROM: Unrestricted ROM      Stability: No instability detected Muscle strength & Tone: Functionally intact Sensory: Unimpaired Palpation: No palpable anomalies              Upper Extremity (UE) Exam    Side: Right upper extremity  Side: Left upper extremity  Inspection: No masses, redness, swelling, or asymmetry. No contractures  Inspection: No masses, redness, swelling, or asymmetry. No contractures  Functional ROM: Unrestricted ROM          Functional ROM: Unrestricted ROM          Muscle strength & Tone: Normal strength (5/5)  Muscle strength & Tone: Normal strength (5/5)  Sensory: Unimpaired  Sensory: Unimpaired  Palpation: No palpable anomalies              Palpation: No palpable anomalies              Specialized Test(s): Phalen's (+)  Specialized Test(s): Phalen's (-)   Thoracic Spine Exam  Inspection: No masses, redness, or swelling Alignment: Symmetrical Functional ROM: Unrestricted ROM Stability: No instability detected Sensory: Unimpaired Muscle strength & Tone: No palpable anomalies  Lumbar Spine Exam  Inspection: No masses, redness, or swelling Alignment: Symmetrical Functional ROM: Unrestricted ROM      Stability: No instability detected Muscle strength & Tone: Functionally intact Sensory: Unimpaired Palpation: No palpable anomalies       Provocative Tests: Lumbar Hyperextension and rotation test: evaluation deferred today       Patrick's Maneuver: evaluation deferred today                    Gait & Posture Assessment  Ambulation:  Unassisted Gait: Relatively normal for age and body habitus Posture: WNL   Lower Extremity Exam    Side: Right lower extremity  Side: Left lower extremity  Inspection: mild edema to knee  Inspection: No masses, redness, swelling, or asymmetry. No contractures  Functional ROM: Mechanically restricted ROM for knee joint  Functional ROM: Unrestricted ROM          Muscle strength & Tone: Functionally intact  Muscle strength & Tone: Functionally intact  Sensory: Movement-associated pain  Sensory: Unimpaired  Palpation: Tender  Palpation: No palpable anomalies   Assessment  Primary Diagnosis & Pertinent Problem List: The primary encounter diagnosis was Chronic pain of both lower extremities (Primary Area of Pain) (R>L). Diagnoses of Chronic pain of right knee (Secondary Area of Pain), Chronic hand pain, right [facility] Area of Pain), Chronic right shoulder pain, Chronic pain syndrome, Pharmacologic therapy, Disorder of skeletal system, and Problems influencing health status were also pertinent to this  visit.  Visit Diagnosis: 1. Chronic pain of both lower extremities (Primary Area of Pain) (R>L)   2. Chronic pain of right knee (Secondary Area of Pain)   3. Chronic hand pain, right [facility] Area of Pain)   4. Chronic right shoulder pain   5. Chronic pain syndrome   6. Pharmacologic therapy   7. Disorder of skeletal system   8. Problems influencing health status    Plan of Care  Initial treatment plan:  Please be advised that as per protocol, today's visit has been an evaluation only. We have not taken over the patient's controlled substance management.  Problem-specific plan: No problem-specific Assessment & Plan notes found for this encounter.  Ordered Lab-work, Procedure(s), Referral(s), & Consult(s): Orders Placed This Encounter  Procedures  . DG Shoulder Right  . DG Hand Complete Right  . Compliance Drug Analysis, Ur  . Comp. Metabolic Panel (12)  . Magnesium  . Vitamin B12  .  Sedimentation rate  . 25-Hydroxyvitamin D Lcms D2+D3  . C-reactive protein  . Ambulatory referral to Physical Therapy   Pharmacotherapy: Medications ordered:  No orders of the defined types were placed in this encounter.  Medications administered during this visit: Thomas Benton had no medications administered during this visit.   Pharmacotherapy under consideration:  Opioid Analgesics: The patient was informed that there is no guarantee that he would be a candidate for opioid analgesics. The decision will be made following CDC guidelines. This decision will be based on the results of diagnostic studies, as well as Thomas Benton risk profile.  Membrane stabilizer: To be determined at a later time Muscle relaxant: To be determined at a later time NSAID: To be determined at a later time Other analgesic(s): To be determined at a later time   Interventional therapies under consideration: Thomas Benton was informed that there is no guarantee that he would be a candidate for interventional therapies. The decision will be based on the results of diagnostic studies, as well as Thomas Benton risk profile.  Possible procedure(s): None   Provider-requested follow-up: Return for 2nd Visit, w/ Dr. Dossie Arbour.  Future Appointments  Date Time Provider Daisy  05/05/2018  7:45 AM Milinda Pointer, MD [facility] None    Primary Care Physician: Sofie Hartigan, MD Location: [facility] Outpatient Pain Management Facility Note by:  Date: 04/16/2018; Time: 11:10 AM  Pain Score Disclaimer: We use the NRS-11 scale. This is a self-reported, subjective measurement of pain severity with only modest accuracy. It is used primarily to identify changes within a particular patient. It must be understood that outpatient pain scales are significantly less accurate that those used for research, where they can be applied under ideal controlled circumstances with minimal exposure to variables. In reality, the  score is likely to be a combination of pain intensity and pain affect, where pain affect describes the degree of emotional arousal or changes in action readiness caused by the sensory experience of pain. Factors such as social and work situation, setting, emotional state, anxiety levels, expectation, and prior pain experience may influence pain perception and show large inter-individual differences that may also be affected by time variables.  Patient instructions provided during this appointment: Patient Instructions   ____________________________________________________________________________________________  Appointment Policy Summary  It is our goal and responsibility to provide the medical community with assistance in the evaluation and management of patients with chronic pain. Unfortunately our resources are limited. Because we do not have an unlimited amount of time, or available appointments, we are  required to closely monitor and manage their use. The following rules exist to maximize their use:  Patient's responsibilities: 1. Punctuality:  At what time should I arrive? You should be physically present in our office 30 minutes before your scheduled appointment. Your scheduled appointment is with your assigned healthcare provider. However, it takes 5-10 minutes to be "checked-in", and another 15 minutes for the nurses to do the admission. If you arrive to our office at the time you were given for your appointment, you will end up being at least 20-25 minutes late to your appointment with the provider. 2. Tardiness:  What happens if I arrive only a few minutes after my scheduled appointment time? You will need to reschedule your appointment. The cutoff is your appointment time. This is why it is so important that you arrive at least 30 minutes before that appointment. If you have an appointment scheduled for 10:00 AM and you arrive at 10:01, you will be required to reschedule your appointment.   3. Plan ahead:  Always assume that you will encounter traffic on your way in. Plan for it. If you are dependent on a driver, make sure they understand these rules and the need to arrive early. 4. Other appointments and responsibilities:  Avoid scheduling any other appointments before or after your pain clinic appointments.  5. Be prepared:  Write down everything that you need to discuss with your healthcare provider and give this information to the admitting nurse. Write down the medications that you will need refilled. Bring your pills and bottles (even the empty ones), to all of your appointments, except for those where a procedure is scheduled. 6. No children or pets:  Find someone to take care of them. It is not appropriate to bring them in. 7. Scheduling changes:  We request "advanced notification" of any changes or cancellations. 8. Advanced notification:  Defined as a time period of more than 24 hours prior to the originally scheduled appointment. This allows for the appointment to be offered to other patients. 9. Rescheduling:  When a visit is rescheduled, it will require the cancellation of the original appointment. For this reason they both fall within the category of "Cancellations".  10. Cancellations:  They require advanced notification. Any cancellation less than 24 hours before the  appointment will be recorded as a "No Show". 11. No Show:  Defined as an unkept appointment where the patient failed to notify or declare to the practice their intention or inability to keep the appointment.  Corrective process for repeat offenders:  1. Tardiness: Three (3) episodes of rescheduling due to late arrivals will be recorded as one (1) "No Show". 2. Cancellation or reschedule: Three (3) cancellations or rescheduling will be recorded as one (1) "No Show". 3. "No Shows": Three (3) "No Shows" within a 12 month period will result in discharge from the  practice. ____________________________________________________________________________________________   ______________________________________________________________________________________________  Specialty Pain Scale  Introduction:  There are significant differences in how pain is reported. The word pain usually refers to physical pain, but it is also a common synonym of suffering. The medical community uses a scale from 0 (zero) to 10 (ten) to report pain level. Zero (0) is described as "no pain", while ten (10) is described as "the worse pain you can imagine". The problem with this scale is that physical pain is reported along with suffering. Suffering refers to mental pain, or more often yet it refers to any unpleasant feeling, emotion or aversion associated with the perception  of harm or threat of harm. It is the psychological component of pain.  Pain Specialists prefer to separate the two components. The pain scale used by this practice is the Verbal Numerical Rating Scale (VNRS-11). This scale is for the physical pain only. DO NOT INCLUDE how your pain psychologically affects you. This scale is for adults 64 years of age and older. It has 11 (eleven) levels. The 1st level is 0/10. This means: "right now, I have no pain". In the context of pain management, it also means: "right now, my physical pain is under control with the current therapy".  General Information:  The scale should reflect your current level of pain. Unless you are specifically asked for the level of your worst pain, or your average pain. If you are asked for one of these two, then it should be understood that it is over the past 24 hours.  Levels 1 (one) through 5 (five) are described below, and can be treated as an outpatient. Ambulatory pain management facilities such as ours are more than adequate to treat these levels. Levels 6 (six) through 10 (ten) are also described below, however, these must be treated as a  hospitalized patient. While levels 6 (six) and 7 (seven) may be evaluated at an urgent care facility, levels 8 (eight) through 10 (ten) constitute medical emergencies and as such, they belong in a hospital's emergency department. When having these levels (as described below), do not come to our office. Our facility is not equipped to manage these levels. Go directly to an urgent care facility or an emergency department to be evaluated.  Definitions:  Activities of Daily Living (ADL): Activities of daily living (ADL or ADLs) is a term used in healthcare to refer to people's daily self-care activities. Health professionals often use a person's ability or inability to perform ADLs as a measurement of their functional status, particularly in regard to people post injury, with disabilities and the elderly. There are two ADL levels: Basic and Instrumental. Basic Activities of Daily Living (BADL  or BADLs) consist of self-care tasks that include: Bathing and showering; personal hygiene and grooming (including brushing/combing/styling hair); dressing; Toilet hygiene (getting to the toilet, cleaning oneself, and getting back up); eating and self-feeding (not including cooking or chewing and swallowing); functional mobility, often referred to as "transferring", as measured by the ability to walk, get in and out of bed, and get into and out of a chair; the broader definition (moving from one place to another while performing activities) is useful for people with different physical abilities who are still able to get around independently. Basic ADLs include the things many people do when they get up in the morning and get ready to go out of the house: get out of bed, go to the toilet, bathe, dress, groom, and eat. On the average, loss of function typically follows a particular order. Hygiene is the first to go, followed by loss of toilet use and locomotion. The last to go is the ability to eat. When there is only one  remaining area in which the person is independent, there is a 62.9% chance that it is eating and only a 3.5% chance that it is hygiene. Instrumental Activities of Daily Living (IADL or IADLs) are not necessary for fundamental functioning, but they let an individual live independently in a community. IADL consist of tasks that include: cleaning and maintaining the house; home establishment and maintenance; care of others (including selecting and supervising caregivers); care of pets;  child rearing; managing money; managing financials (investments, etc.); meal preparation and cleanup; shopping for groceries and necessities; moving within the community; safety procedures and emergency responses; health management and maintenance (taking prescribed medications); and using the telephone or other form of communication.  Instructions:  Most patients tend to report their pain as a combination of two factors, their physical pain and their psychosocial pain. This last one is also known as "suffering" and it is reflection of how physical pain affects you socially and psychologically. From now on, report them separately.  From this point on, when asked to report your pain level, report only your physical pain. Use the following table for reference.  Pain Clinic Pain Levels (0-5/10)  Pain Level Score  Description  No Pain 0   Mild pain 1 Nagging, annoying, but does not interfere with basic activities of daily living (ADL). Patients are able to eat, bathe, get dressed, toileting (being able to get on and off the toilet and perform personal hygiene functions), transfer (move in and out of bed or a chair without assistance), and maintain continence (able to control bladder and bowel functions). Blood pressure and heart rate are unaffected. A normal heart rate for a healthy adult ranges from 60 to 100 bpm (beats per minute).   Mild to moderate pain 2 Noticeable and distracting. Impossible to hide from other people. More  frequent flare-ups. Still possible to adapt and function close to normal. It can be very annoying and may have occasional stronger flare-ups. With discipline, patients may get used to it and adapt.   Moderate pain 3 Interferes significantly with activities of daily living (ADL). It becomes difficult to feed, bathe, get dressed, get on and off the toilet or to perform personal hygiene functions. Difficult to get in and out of bed or a chair without assistance. Very distracting. With effort, it can be ignored when deeply involved in activities.   Moderately severe pain 4 Impossible to ignore for more than a few minutes. With effort, patients may still be able to manage work or participate in some social activities. Very difficult to concentrate. Signs of autonomic nervous system discharge are evident: dilated pupils (mydriasis); mild sweating (diaphoresis); sleep interference. Heart rate becomes elevated (>115 bpm). Diastolic blood pressure (lower number) rises above 100 mmHg. Patients find relief in laying down and not moving.   Severe pain 5 Intense and extremely unpleasant. Associated with frowning face and frequent crying. Pain overwhelms the senses.  Ability to do any activity or maintain social relationships becomes significantly limited. Conversation becomes difficult. Pacing back and forth is common, as getting into a comfortable position is nearly impossible. Pain wakes you up from deep sleep. Physical signs will be obvious: pupillary dilation; increased sweating; goosebumps; brisk reflexes; cold, clammy hands and feet; nausea, vomiting or dry heaves; loss of appetite; significant sleep disturbance with inability to fall asleep or to remain asleep. When persistent, significant weight loss is observed due to the complete loss of appetite and sleep deprivation.  Blood pressure and heart rate becomes significantly elevated. Caution: If elevated blood pressure triggers a pounding headache associated with  blurred vision, then the patient should immediately seek attention at an urgent or emergency care unit, as these may be signs of an impending stroke.    Emergency Department Pain Levels (6-10/10)  Emergency Room Pain 6 Severely limiting. Requires emergency care and should not be seen or managed at an outpatient pain management facility. Communication becomes difficult and requires great effort. Assistance to  reach the emergency department may be required. Facial flushing and profuse sweating along with potentially dangerous increases in heart rate and blood pressure will be evident.   Distressing pain 7 Self-care is very difficult. Assistance is required to transport, or use restroom. Assistance to reach the emergency department will be required. Tasks requiring coordination, such as bathing and getting dressed become very difficult.   Disabling pain 8 Self-care is no longer possible. At this level, pain is disabling. The individual is unable to do even the most "basic" activities such as walking, eating, bathing, dressing, transferring to a bed, or toileting. Fine motor skills are lost. It is difficult to think clearly.   Incapacitating pain 9 Pain becomes incapacitating. Thought processing is no longer possible. Difficult to remember your own name. Control of movement and coordination are lost.   The worst pain imaginable 10 At this level, most patients pass out from pain. When this level is reached, collapse of the autonomic nervous system occurs, leading to a sudden drop in blood pressure and heart rate. This in turn results in a temporary and dramatic drop in blood flow to the brain, leading to a loss of consciousness. Fainting is one of the body's self defense mechanisms. Passing out puts the brain in a calmed state and causes it to shut down for a while, in order to begin the healing process.    Summary: 1. Refer to this scale when providing [place] with your pain level. 2. Be accurate and careful  when reporting your pain level. This will help with your care. 3. Over-reporting your pain level will lead to loss of credibility. 4. Even a level of 1/10 means that there is pain and will be treated at our facility. 5. High, inaccurate reporting will be documented as "Symptom Exaggeration", leading to loss of credibility and suspicions of possible secondary gains such as obtaining more narcotics, or wanting to appear disabled, for fraudulent reasons. 6. Only pain levels of 5 or below will be seen at our facility. 7. Pain levels of 6 and above will be sent to the Emergency Department and the appointment cancelled. ______________________________________________________________________________________________   BMI Assessment: Estimated body mass index is 43.33 kg/m as calculated from the following:   Height as of this encounter: '5\' 8"'  (1.727 m).   Weight as of this encounter: 285 lb (129.3 kg).  BMI interpretation table: BMI level Category Range association with higher incidence of chronic pain  <18 kg/m2 Underweight   18.5-24.9 kg/m2 Ideal body weight   25-29.9 kg/m2 Overweight Increased incidence by 20%  30-34.9 kg/m2 Obese (Class I) Increased incidence by 68%  35-39.9 kg/m2 Severe obesity (Class II) Increased incidence by 136%  >40 kg/m2 Extreme obesity (Class III) Increased incidence by 254%   BMI Readings from Last 4 Encounters:  04/16/18 43.33 kg/m   Wt Readings from Last 4 Encounters:  04/16/18 285 lb (129.3 kg)

## 2018-04-21 LAB — COMP. METABOLIC PANEL (12)
ALK PHOS: 82 IU/L (ref 39–117)
AST: 20 IU/L (ref 0–40)
Albumin/Globulin Ratio: 1.6 (ref 1.2–2.2)
Albumin: 4.1 g/dL (ref 3.5–5.5)
BUN/Creatinine Ratio: 16 (ref 9–20)
BUN: 11 mg/dL (ref 6–24)
Bilirubin Total: 0.3 mg/dL (ref 0.0–1.2)
Calcium: 9.2 mg/dL (ref 8.7–10.2)
Chloride: 101 mmol/L (ref 96–106)
Creatinine, Ser: 0.7 mg/dL — ABNORMAL LOW (ref 0.76–1.27)
GFR calc Af Amer: 134 mL/min/{1.73_m2} (ref >59)
GFR, EST NON AFRICAN AMERICAN: 116 mL/min/{1.73_m2} (ref >59)
GLOBULIN, TOTAL: 2.6 g/dL (ref 1.5–4.5)
GLUCOSE: 381 mg/dL — AB (ref 65–99)
POTASSIUM: 4.7 mmol/L (ref 3.5–5.2)
Sodium: 136 mmol/L (ref 134–144)
Total Protein: 6.7 g/dL (ref 6.0–8.5)

## 2018-04-21 LAB — VITAMIN B12: VITAMIN B 12: 599 pg/mL (ref 232–1245)

## 2018-04-21 LAB — 25-HYDROXYVITAMIN D LCMS D2+D3
25-HYDROXY, VITAMIN D-3: 18 ng/mL
25-HYDROXY, VITAMIN D: 19 ng/mL — AB

## 2018-04-21 LAB — 25-HYDROXY VITAMIN D LCMS D2+D3: 25-Hydroxy, Vitamin D-2: <1 ng/mL

## 2018-04-21 LAB — SEDIMENTATION RATE: Sed Rate: 8 mm/hr (ref 0–15)

## 2018-04-21 LAB — C-REACTIVE PROTEIN: CRP: 2 mg/L (ref 0–10)

## 2018-04-21 LAB — MAGNESIUM: MAGNESIUM: 1.8 mg/dL (ref 1.6–2.3)

## 2018-04-21 NOTE — Progress Notes (Signed)
Results were reviewed and found to be: mildly abnormal  No acute injury or pathology identified  Review would suggest interventional pain management techniques may be of benefit 

## 2018-04-23 LAB — COMPLIANCE DRUG ANALYSIS, UR

## 2018-05-04 NOTE — Progress Notes (Deleted)
Patient's Name: Ihsan Nomura  MRN: [id number]  Referring Provider: Sofie Hartigan, MD  DOB: Dec 08, 1975  PCP: Sofie Hartigan, MD  DOS: 05/05/2018  Note by: Gaspar Cola, MD  Service setting: Ambulatory outpatient  Specialty: Interventional Pain Management  Location: [facility] (AMB) Pain Management Facility    Patient type: Established   Primary Reason(s) for Visit: Encounter for evaluation before starting new chronic pain management plan of care (Level of risk: moderate) CC: No chief complaint on file.  HPI  Mr. Edmundson is a 42 y.o. year old, male patient, who comes today for a follow-up evaluation to review the test results and decide on a treatment plan. He has Cellulitis of foot (Left); Diabetic neuropathy, type II diabetes mellitus ([place]); Hypothyroidism (acquired); Morbid obesity with BMI of 40.0-44.9, adult ([place]); Osteoarthritis of knee (Right); Type 2 diabetes mellitus without complications ([place]); Chronic lower extremity pain (Primary Area of Pain) (Bilateral) (R>L); Chronic pain of right knee (Secondary Area of Pain); Chronic hand pain [facility] Area of Pain) (Right); Chronic shoulder pain (Right); Chronic pain syndrome; Pharmacologic therapy; Disorder of skeletal system; and Problems influencing health status on their problem list. His primarily concern today is the No chief complaint on file.  Pain Assessment: Location:     Radiating:   Onset:   Duration:   Quality:   Severity:  /10 (subjective, self-reported pain score)  Note: Reported level is compatible with observation.                         When using our objective Pain Scale, levels between 6 and 10/10 are said to belong in an emergency room, as it progressively worsens from a 6/10, described as severely limiting, requiring emergency care not usually available at an outpatient pain management facility. At a 6/10 level, communication becomes difficult and requires great effort. Assistance to reach the emergency department  may be required. Facial flushing and profuse sweating along with potentially dangerous increases in heart rate and blood pressure will be evident. Effect on ADL:   Timing:   Modifying factors:   BP:    HR:    Mr. Davenport comes in today for a follow-up visit after his initial evaluation on 04/16/2018. Today we went over the results of his tests. These were explained in "Layman's terms". During today's appointment we went over my diagnostic impression, as well as the proposed treatment plan.  According to the patient his primary area of pain is in his legs.  He admits that they are about equal.  He denies any injury.  He admits the pain has been going on for approximately 10 years.  He feels like this is related to peripheral neuropathy from diabetes.  He has some numbness and tingling.  He denies weakness.  He admits that he has failed Lyrica, tramadol, and gabapentin.  He denies previous nerve conduction study.  His second area pain is in his right knee.  He admits this was from an old injury where he fell off a ladder.  He denies any previous surgery, interventional therapy or physical therapy.  He admits that he did have recent images done at [facility] in [place].  He has been prescribed a brace and is currently wearing this.  His third area pain is in his right hand.  He denies any previous injury.  The pain affects fingers 3, 4 and 5 the outside of his hand.  He describes it as a aching numbness feeling.  He admits that he does have some weakness with grabbing.  In considering the treatment plan options, Mr. Gurka was reminded that I no longer take patients for medication management only. I asked him to let me know if he had no intention of taking advantage of the interventional therapies, so that we could make arrangements to provide this space to someone interested. I also made it clear that undergoing interventional therapies for the purpose of getting pain medications is very  inappropriate on the part of a patient, and it will not be tolerated in this practice. This type of behavior would suggest true addiction and therefore it requires referral to an addiction specialist.   Further details on both, my assessment(s), as well as the proposed treatment plan, please see below.  Controlled Substance Pharmacotherapy Assessment REMS (Risk Evaluation and Mitigation Strategy)  Analgesic: None Highest recorded MME/day: 30 mg/day MME/day: 0 mg/day Pill Count: None expected due to no prior prescriptions written by our practice. No notes on file` Pharmacokinetics: Liberation and absorption (onset of action): WNL Distribution (time to peak effect): WNL Metabolism and excretion (duration of action): WNL         Pharmacodynamics: Desired effects: Analgesia: Mr. Aprea reports >50% benefit. Functional ability: Patient reports that medication allows him to accomplish basic ADLs Clinically meaningful improvement in function (CMIF): Sustained CMIF goals met Perceived effectiveness: Described as relatively effective, allowing for increase in activities of daily living (ADL) Undesirable effects: Side-effects or Adverse reactions: None reported Monitoring: [place] PMP: Online review of the past 74-monthperiod previously conducted. Not applicable at this point since we have not taken over the patient's medication management yet. List of other Serum/Urine Drug Screening Test(s):  No results found. List of all UDS test(s) done:  Lab Results  Component Value Date   SUMMARY FINAL 04/16/2018   Last UDS on record: Summary  Date Value Ref Range Status  04/16/2018 FINAL  Final    Comment:    ==================================================================== TOXASSURE COMP DRUG ANALYSIS,UR ==================================================================== Test                             Result       Flag       Units   NO DRUGS  DETECTED. ==================================================================== Test                      Result    Flag   Units      Ref Range   Creatinine              70               mg/dL      >=20 ==================================================================== Declared Medications:  The flagging and interpretation on this report are based on the  following declared medications.  Unexpected results may arise from  inaccuracies in the declared medications.  **Note: The testing scope of this panel does not include following  reported medications:  Metformin (Glucophage) ==================================================================== For clinical consultation, please call [phone] ====================================================================    UDS interpretation: No unexpected findings.          Medication Assessment Form: Patient introduced to form today Treatment compliance: Treatment may start today if patient agrees with proposed plan. Evaluation of compliance is not applicable at this point Risk Assessment Profile: Aberrant behavior: See initial evaluations. None observed or detected today Comorbid factors increasing risk of overdose: See initial evaluation. No additional risks detected today  Opioid risk tool (ORT) (Total Score):   Personal History of Substance Abuse (SUD-Substance use disorder):  Alcohol:    Illegal Drugs:    Rx Drugs:    ORT Risk Level calculation:   Risk of substance use disorder (SUD): Low  ORT Scoring interpretation table:  Score <3 = Low Risk for SUD  Score between 4-7 = Moderate Risk for SUD  Score >8 = High Risk for Opioid Abuse   Risk Mitigation Strategies:  Patient opioid safety counseling: Completed today. Counseling provided to patient as per "Patient Counseling Document". Document signed by patient, attesting to counseling and understanding Patient-Prescriber Agreement (PPA): Obtained today.  Controlled substance  notification to other providers: Written and sent today.  Pharmacologic Plan: Today we may be taking over the patient's pharmacological regimen. See below.             Laboratory Chemistry  Inflammation Markers (CRP: Acute Phase) (ESR: Chronic Phase) Lab Results  Component Value Date   CRP 2 04/16/2018   ESRSEDRATE 8 04/16/2018                         Rheumatology Markers No results found.  Renal Function Markers Lab Results  Component Value Date   BUN 11 04/16/2018   CREATININE 0.70 (L) 04/16/2018   BCR 16 04/16/2018   GFRAA 134 04/16/2018   GFRNONAA 116 04/16/2018                             Hepatic Function Markers Lab Results  Component Value Date   AST 20 04/16/2018   ALT 51 05/09/2014   ALBUMIN 4.1 04/16/2018   ALKPHOS 82 04/16/2018                        Electrolytes Lab Results  Component Value Date   NA 136 04/16/2018   K 4.7 04/16/2018   CL 101 04/16/2018   CALCIUM 9.2 04/16/2018   MG 1.8 04/16/2018                        Neuropathy Markers Lab Results  Component Value Date   VITAMINB12 599 04/16/2018                        CNS Tests No results found.  Bone Pathology Markers Lab Results  Component Value Date   25OHVITD1 19 (L) 04/16/2018   25OHVITD2 <1.0 04/16/2018   25OHVITD3 18 04/16/2018                         Coagulation Parameters Lab Results  Component Value Date   PLT 211 05/09/2014                        Cardiovascular Markers Lab Results  Component Value Date   TROPONINI < 0.02 08/21/2011   HGB 16.0 05/09/2014   HCT 45.9 05/09/2014                         CA Markers No results found.  Note: Lab results reviewed.  Recent Diagnostic Imaging Review  Shoulder Imaging: Shoulder-R DG:  Results for orders placed during the hospital encounter of 04/16/18  DG Shoulder Right   Narrative CLINICAL DATA:  RIGHT shoulder pain  EXAM: RIGHT SHOULDER -  2+ VIEW  COMPARISON:  None  FINDINGS: Osseous mineralization  normal.  AC joint alignment normal.  No acute fracture, dislocation, or bone destruction.  IMPRESSION: Normal exam.   Electronically Signed   By: Lavonia Dana M.D.   On: 04/16/2018 16:23    Hand Imaging: Hand-R DG Complete:  Results for orders placed during the hospital encounter of 04/16/18  DG Hand Complete Right   Narrative CLINICAL DATA:  Chronic hand pain, numbness around little finger  EXAM: RIGHT HAND - COMPLETE 3+ VIEW  COMPARISON:  None  FINDINGS: Osseous mineralization normal.  Joint spaces preserved.  Probable mild spurring at the distal radioulnar joint.  No acute fracture, dislocation, or bone destruction.  IMPRESSION: No acute osseous abnormalities.  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   Electronically Signed   By: Lavonia Dana M.D.   On: 04/16/2018 16:24    Complexity Note: Imaging results reviewed. Results shared with Mr. Longest, using Layman's terms.                         Meds   Current Outpatient Medications:  .  metFORMIN (GLUCOPHAGE) 500 MG tablet, Take by mouth 2 (two) times daily with a meal., Disp: , Rfl:   ROS  Constitutional: Denies any fever or chills Gastrointestinal: No reported hemesis, hematochezia, vomiting, or acute GI distress Musculoskeletal: Denies any acute onset joint swelling, redness, loss of ROM, or weakness Neurological: No reported episodes of acute onset apraxia, aphasia, dysarthria, agnosia, amnesia, paralysis, loss of coordination, or loss of consciousness  Allergies  Mr. Rorke has no allergies on file.  PFSH  Drug: Mr. Bhat  has no history on file for drug. Alcohol:  has no history on file for alcohol. Tobacco:  reports that he has been smoking. He has been smoking about 0.50 packs per day. He uses smokeless tobacco. Medical:  has a past medical history of Arthritis and Diabetes mellitus without complication ([place]). Surgical: Mr. Enrico  has no past surgical history  on file. Family: family history is not on file.  Constitutional Exam  General appearance: Well nourished, well developed, and well hydrated. In no apparent acute distress There were no vitals filed for this visit. BMI Assessment: Estimated body mass index is 43.33 kg/m as calculated from the following:   Height as of 04/16/18: '5\' 8"'  (1.727 m).   Weight as of 04/16/18: 285 lb (129.3 kg).  BMI interpretation table: BMI level Category Range association with higher incidence of chronic pain  <18 kg/m2 Underweight   18.5-24.9 kg/m2 Ideal body weight   25-29.9 kg/m2 Overweight Increased incidence by 20%  30-34.9 kg/m2 Obese (Class I) Increased incidence by 68%  35-39.9 kg/m2 Severe obesity (Class II) Increased incidence by 136%  >40 kg/m2 Extreme obesity (Class III) Increased incidence by 254%   Patient's current BMI Ideal Body weight  There is no height or weight on file to calculate BMI. Patient weight not recorded   BMI Readings from Last 4 Encounters:  04/16/18 43.33 kg/m   Wt Readings from Last 4 Encounters:  04/16/18 285 lb (129.3 kg)  Psych/Mental status: Alert, oriented x 3 (person, place, & time)       Eyes: PERLA Respiratory: No evidence of acute respiratory distress  Cervical Spine Area Exam  Skin & Axial Inspection: No masses, redness, edema, swelling, or associated skin lesions Alignment: Symmetrical Functional ROM: Unrestricted ROM      Stability: No instability detected Muscle Tone/Strength: Functionally  intact. No obvious neuro-muscular anomalies detected. Sensory (Neurological): Unimpaired Palpation: No palpable anomalies              Upper Extremity (UE) Exam    Side: Right upper extremity  Side: Left upper extremity  Skin & Extremity Inspection: Skin color, temperature, and hair growth are WNL. No peripheral edema or cyanosis. No masses, redness, swelling, asymmetry, or associated skin lesions. No contractures.  Skin & Extremity Inspection: Skin color,  temperature, and hair growth are WNL. No peripheral edema or cyanosis. No masses, redness, swelling, asymmetry, or associated skin lesions. No contractures.  Functional ROM: Unrestricted ROM          Functional ROM: Unrestricted ROM          Muscle Tone/Strength: Functionally intact. No obvious neuro-muscular anomalies detected.  Muscle Tone/Strength: Functionally intact. No obvious neuro-muscular anomalies detected.  Sensory (Neurological): Unimpaired          Sensory (Neurological): Unimpaired          Palpation: No palpable anomalies              Palpation: No palpable anomalies              Provocative Test(s):  Phalen's test: deferred Tinel's test: deferred Apley's scratch test (touch opposite shoulder):  Action 1 (Across chest): deferred Action 2 (Overhead): deferred Action 3 (LB reach): deferred   Provocative Test(s):  Phalen's test: deferred Tinel's test: deferred Apley's scratch test (touch opposite shoulder):  Action 1 (Across chest): deferred Action 2 (Overhead): deferred Action 3 (LB reach): deferred    Thoracic Spine Area Exam  Skin & Axial Inspection: No masses, redness, or swelling Alignment: Symmetrical Functional ROM: Unrestricted ROM Stability: No instability detected Muscle Tone/Strength: Functionally intact. No obvious neuro-muscular anomalies detected. Sensory (Neurological): Unimpaired Muscle strength & Tone: No palpable anomalies  Lumbar Spine Area Exam  Skin & Axial Inspection: No masses, redness, or swelling Alignment: Symmetrical Functional ROM: Unrestricted ROM       Stability: No instability detected Muscle Tone/Strength: Functionally intact. No obvious neuro-muscular anomalies detected. Sensory (Neurological): Unimpaired Palpation: No palpable anomalies       Provocative Tests: Hyperextension/rotation test: deferred today       Lumbar quadrant test (Kemp's test): deferred today       Lateral bending test: deferred today       Patrick's Maneuver:  deferred today                   FABER* test: deferred today                   S-I anterior distraction/compression test: deferred today         S-I lateral compression test: deferred today         S-I Thigh-thrust test: deferred today         S-I Gaenslen's test: deferred today         *(Flexion, ABduction and External Rotation)  Gait & Posture Assessment  Ambulation: Unassisted Gait: Relatively normal for age and body habitus Posture: WNL   Lower Extremity Exam    Side: Right lower extremity  Side: Left lower extremity  Stability: No instability observed          Stability: No instability observed          Skin & Extremity Inspection: Skin color, temperature, and hair growth are WNL. No peripheral edema or cyanosis. No masses, redness, swelling, asymmetry, or associated skin lesions. No contractures.  Skin &  Extremity Inspection: Skin color, temperature, and hair growth are WNL. No peripheral edema or cyanosis. No masses, redness, swelling, asymmetry, or associated skin lesions. No contractures.  Functional ROM: Unrestricted ROM                  Functional ROM: Unrestricted ROM                  Muscle Tone/Strength: Functionally intact. No obvious neuro-muscular anomalies detected.  Muscle Tone/Strength: Functionally intact. No obvious neuro-muscular anomalies detected.  Sensory (Neurological): Unimpaired        Sensory (Neurological): Unimpaired        DTR: Patellar: deferred today Achilles: deferred today Plantar: deferred today  DTR: Patellar: deferred today Achilles: deferred today Plantar: deferred today  Palpation: No palpable anomalies  Palpation: No palpable anomalies   Assessment & Plan  Primary Diagnosis & Pertinent Problem List: The primary encounter diagnosis was Chronic pain syndrome. Diagnoses of Chronic lower extremity pain (Primary Area of Pain) (Bilateral) (R>L), Chronic pain of right knee (Secondary Area of Pain), Chronic hand pain (Tertiary Area of Pain) (Right),  Osteoarthritis of knee (Right), and Chronic shoulder pain (Right) were also pertinent to this visit.  Visit Diagnosis: 1. Chronic pain syndrome   2. Chronic lower extremity pain (Primary Area of Pain) (Bilateral) (R>L)   3. Chronic pain of right knee (Secondary Area of Pain)   4. Chronic hand pain [facility] Area of Pain) (Right)   5. Osteoarthritis of knee (Right)   6. Chronic shoulder pain (Right)    Problems updated and reviewed during this visit: Problem  Diabetic Neuropathy, Type II Diabetes Mellitus ([facility])  Chronic lower extremity pain (Primary Area of Pain) (Bilateral) (R>L)  Chronic pain of right knee (Secondary Area of Pain)  Chronic hand pain (Tertiary Area of Pain) (Right)  Chronic shoulder pain (Right)  Chronic Pain Syndrome  Osteoarthritis of knee (Right)  Cellulitis of foot (Left)  Pharmacologic Therapy  Disorder of Skeletal System  Problems Influencing Health Status  Morbid Obesity With Bmi of 40.0-44.9, Adult ([facility])  Hypothyroidism (Acquired)  Type 2 Diabetes Mellitus Without Complications ([facility])    Plan of Care  Pharmacotherapy (Medications Ordered): No orders of the defined types were placed in this encounter.  Procedure Orders    No procedure(s) ordered today   Lab Orders  No laboratory test(s) ordered today   Imaging Orders  No imaging studies ordered today   Referral Orders  No referral(s) requested today    Pharmacological management options:  Opioid Analgesics: We'll take over management today. See above orders Membrane stabilizer: We have discussed the possibility of optimizing this mode of therapy, if tolerated Muscle relaxant: We have discussed the possibility of a trial NSAID: We have discussed the possibility of a trial Other analgesic(s): To be determined at a later time   Interventional management options: Planned, scheduled, and/or pending:    ***   Considering:   None    PRN Procedures:   None at this time   Provider-requested  follow-up: No follow-ups on file.  No future appointments.  Primary Care Physician: Sofie Hartigan, MD Location: [facility] Note by: Gaspar Cola, MD Date: 05/05/2018; Time: 7:58 AM

## 2018-05-05 ENCOUNTER — Ambulatory Visit: Payer: BLUE CROSS/BLUE SHIELD | Admitting: Pain Medicine

## 2018-05-06 DIAGNOSIS — M792 Neuralgia and neuritis, unspecified: Secondary | ICD-10-CM | POA: Insufficient documentation

## 2018-05-06 DIAGNOSIS — E559 Vitamin D deficiency, unspecified: Secondary | ICD-10-CM | POA: Insufficient documentation

## 2018-05-06 NOTE — Progress Notes (Signed)
Patient's Name: Thomas Benton  MRN: [id number]  Referring Provider: Sofie Hartigan, MD  DOB: 12/28/1975  PCP: Thomas Hartigan, MD  DOS: 05/07/2018  Note by: Gaspar Cola, MD  Service setting: Ambulatory outpatient  Specialty: Interventional Pain Management  Location: [facility] (AMB) Pain Management Facility    Patient type: Established   Primary Reason(s) for Visit: Encounter for evaluation before starting new chronic pain management plan of care (Level of risk: moderate) CC: Foot Pain and Leg Pain  HPI  Thomas Benton is a 42 y.o. year old, male patient, who comes today for a follow-up evaluation to review the test results and decide on a treatment plan. He has Cellulitis of foot (Left); Diabetic neuropathy, type II diabetes mellitus ([employer]); Hypothyroidism (acquired); Morbid obesity with BMI of 40.0-44.9, adult ([place]); Osteoarthritis of knee (Right); Type 2 diabetes mellitus without complications ([place]); Chronic lower extremity pain (Primary Area of Pain) (Bilateral) (R>L); Chronic knee pain (Secondary Area of Pain) (Right); Chronic hand pain [facility] Area of Pain) (Right); Chronic shoulder pain (Right); Chronic pain syndrome; Pharmacologic therapy; Disorder of skeletal system; Problems influencing health status; Vitamin D deficiency; and Neuropathic pain on their problem list. His primarily concern today is the Foot Pain and Leg Pain  Pain Assessment: Location: Right, Left Foot Radiating: denies Onset: More than a month ago Duration: Chronic pain Quality: Throbbing, Stabbing Severity: 9 /10 (subjective, self-reported pain score)  Note: Reported level is inconsistent with clinical observations. Clinically the patient looks like a 3/10 A 3/10 is viewed as "Moderate" and described as significantly interfering with activities of daily living (ADL). It becomes difficult to feed, bathe, get dressed, get on and off the toilet or to perform personal hygiene functions. Difficult to get in and out of  bed or a chair without assistance. Very distracting. With effort, it can be ignored when deeply involved in activities. Information on the proper use of the pain scale provided to the patient today. When using our objective Pain Scale, levels between 6 and 10/10 are said to belong in an emergency room, as it progressively worsens from a 6/10, described as severely limiting, requiring emergency care not usually available at an outpatient pain management facility. At a 6/10 level, communication becomes difficult and requires great effort. Assistance to reach the emergency department may be required. Facial flushing and profuse sweating along with potentially dangerous increases in heart rate and blood pressure will be evident. Effect on ADL: "It is starting to afect my work" Timing: Constant Modifying factors: nothing BP: (!) 146/78  HR: 75  Thomas Benton comes in today for a follow-up visit after his initial evaluation on 04/16/2018. Today we went over the results of his tests. These were explained in "Layman's terms". During today's appointment we went over my diagnostic impression, as well as the proposed treatment plan.  According to the patient his primary area of pain is in his legs (B) (R>L). He admits that they are about equal.  He denies any injury.  He admits the pain has been going on for approximately 10 years.  He feels like this is related to peripheral neuropathy from diabetes.  He has some numbness and tingling.  He describes the pain as a throbbing, pinching and shooting pain, worse in his feet than in the leg. Pain is over the top and the lateral aspect of the foot. Usually it does not affect the toes.  This particular pain pattern does not seem to fit the typical diabetic peripheral neuropathy pain.  He denies weakness. He denies any back pain. He denies previous nerve conduction study.   He admits that he has failed Lyrica, tramadol, and gabapentin.    Pregabalin (Lyrica): Trial around  01/11/2017. 50 mg tid. SE/AR maybe some sleepiness.   Tramadol (Ultram): One of the first things they tried about 10 yrs ago.   Gabapentin (Neurontin): Tried it in the last 7-10 years and indicated that it gave him an upset stomach and did not help with the pain.   His second area pain is in his right knee (R).  He admits this was from an old injury where he fell off a ladder.  He denies any previous surgery, interventional therapy or physical therapy.  He admits that he did have recent images done at [facility] in [place].  He has been prescribed a brace and is currently wearing this. Offered arthroscopic surgery for the knee, but cannot afford to be out of work for the recovery.  His third area pain is in his right hand (R).  He denies any previous injury.  The pain affects fingers 3, 4 and 5 of his hand.  He describes it as a aching numbness feeling.  He admits that he does have some weakness with grabbing.   On the last visit, we referred him to physical therapy.  Social: He seems to be a very decent and friendly individual who is a single father, struggling to pay for his care.  Currently he holds a job and he is working, but he indicates that with a limited budget, the co-pays for his medical care are difficult to meet.  He says that he is often faced with having to make a choice between his healthcare and feeding his kids.  Reason for visit: He indicates that he recently ended up in the ED due to his diabetic neuropathic pain.  At the time, he was also found to have his blood sugar elevated.  This was treated and he was given an oxycodone for the pain.  He indicates that it did not completely eliminate his pain, but it did help.  The ED physician recommended Chronic Pain Management.  He indicates that when he went to see his PCP, he was informed that he could not provide him with any oxycodone he was then referred to [place] for evaluation.  In considering the treatment plan options, Mr.  Benton was reminded that I no longer take patients for medication management only. I asked him to let me know if he had no intention of taking advantage of the interventional therapies, so that we could make arrangements to provide this space to someone interested. I also made it clear that undergoing interventional therapies for the purpose of getting pain medications is very inappropriate on the part of a patient, and it will not be tolerated in this practice. This type of behavior would suggest true addiction and therefore it requires referral to an addiction specialist.   Further details on both, my assessment(s), as well as the proposed treatment plan, please see below.  Controlled Substance Pharmacotherapy Assessment REMS (Risk Evaluation and Mitigation Strategy)  Analgesic: None Highest recorded MME/day: 30 mg/day MME/day: 0 mg/day Pill Count: None expected due to no prior prescriptions written by our practice. Dewayne Shorter, RN  05/07/2018  8:10 AM  Signed Safety precautions to be maintained throughout the outpatient stay will include: orient to surroundings, keep bed in low position, maintain call bell within reach at all times, provide assistance with transfer  out of bed and ambulation.   Pharmacokinetics: Liberation and absorption (onset of action): WNL Distribution (time to peak effect): WNL Metabolism and excretion (duration of action): WNL         Pharmacodynamics: Desired effects: Analgesia: Mr. Cozart reports >50% benefit. Functional ability: Patient reports that medication allows him to accomplish basic ADLs Clinically meaningful improvement in function (CMIF): Sustained CMIF goals met Perceived effectiveness: Described as relatively effective, allowing for increase in activities of daily living (ADL) Undesirable effects: Side-effects or Adverse reactions: None reported Monitoring: [place] PMP: Online review of the past 62-monthperiod previously conducted. Not applicable at  this point since we have not taken over the patient's medication management yet. List of other Serum/Urine Drug Screening Test(s):  No results found. List of all UDS test(s) done:  Lab Results  Component Value Date   SUMMARY FINAL 04/16/2018   Last UDS on record: Summary  Date Value Ref Range Status  04/16/2018 FINAL  Final    Comment:    ==================================================================== TOXASSURE COMP DRUG ANALYSIS,UR ==================================================================== Test                             Result       Flag       Units   NO DRUGS DETECTED. ==================================================================== Test                      Result    Flag   Units      Ref Range   Creatinine              70               mg/dL      >=20 ==================================================================== Declared Medications:  The flagging and interpretation on this report are based on the  following declared medications.  Unexpected results may arise from  inaccuracies in the declared medications.  **Note: The testing scope of this panel does not include following  reported medications:  Metformin (Glucophage) ==================================================================== For clinical consultation, please call ([phone] ====================================================================    UDS interpretation: No unexpected findings.          Medication Assessment Form: Not applicable. No opioids. Treatment compliance: Not applicable Risk Assessment Profile: Aberrant behavior: See initial evaluations. None observed or detected today Comorbid factors increasing risk of overdose: See initial evaluation. No additional risks detected today Opioid risk tool (ORT) (Total Score): 1 Personal History of Substance Abuse (SUD-Substance use disorder):  Alcohol: Negative  Illegal Drugs: Negative  Rx Drugs: Negative  ORT Risk Level  calculation: Low Risk Risk of substance use disorder (SUD): Low Opioid Risk Tool - 05/07/18 0809      Family History of Substance Abuse   Alcohol  Negative    Illegal Drugs  Negative    Rx Drugs  Negative      Personal History of Substance Abuse   Alcohol  Negative    Illegal Drugs  Negative    Rx Drugs  Negative      Age   Age between 115-45years   Yes      History of Preadolescent Sexual Abuse   History of Preadolescent Sexual Abuse  Negative or Male      Psychological Disease   Psychological Disease  Negative    Depression  Negative      Total Score   Opioid Risk Tool Scoring  1    Opioid Risk Interpretation  Low  Risk      ORT Scoring interpretation table:  Score <3 = Low Risk for SUD  Score between 4-7 = Moderate Risk for SUD  Score >8 = High Risk for Opioid Abuse   Risk Mitigation Strategies:  Patient opioid safety counseling: No controlled substances prescribed. Patient-Prescriber Agreement (PPA): No agreement signed.  Controlled substance notification to other providers: None required. No opioid therapy.  Pharmacologic Plan: Mr. Lanni is not a candidate for opioid therapy at this time. Evaluation of the available data does not provide sufficient evidence of a pathology for which the opioid analgesics would be indicated or required.  Laboratory Chemistry  Inflammation Markers (CRP: Acute Phase) (ESR: Chronic Phase) Lab Results  Component Value Date   CRP 2 04/16/2018   ESRSEDRATE 8 04/16/2018                         Rheumatology Markers No results found.  Renal Function Markers Lab Results  Component Value Date   BUN 11 04/16/2018   CREATININE 0.70 (L) 04/16/2018   BCR 16 04/16/2018   GFRAA 134 04/16/2018   GFRNONAA 116 04/16/2018                             Hepatic Function Markers Lab Results  Component Value Date   AST 20 04/16/2018   ALT 51 05/09/2014   ALBUMIN 4.1 04/16/2018   ALKPHOS 82 04/16/2018                         Electrolytes Lab Results  Component Value Date   NA 136 04/16/2018   K 4.7 04/16/2018   CL 101 04/16/2018   CALCIUM 9.2 04/16/2018   MG 1.8 04/16/2018                        Neuropathy Markers Lab Results  Component Value Date   VITAMINB12 599 04/16/2018                        CNS Tests No results found.  Bone Pathology Markers Lab Results  Component Value Date   25OHVITD1 19 (L) 04/16/2018   25OHVITD2 <1.0 04/16/2018   25OHVITD3 18 04/16/2018                         Coagulation Parameters Lab Results  Component Value Date   PLT 211 05/09/2014                        Cardiovascular Markers Lab Results  Component Value Date   TROPONINI < 0.02 08/21/2011   HGB 16.0 05/09/2014   HCT 45.9 05/09/2014                         CA Markers No results found.  Note: Lab results reviewed.  Recent Diagnostic Imaging Review  Shoulder Imaging: Shoulder-R DG:  Results for orders placed during the hospital encounter of 04/16/18  DG Shoulder Right   Narrative CLINICAL DATA:  RIGHT shoulder pain  EXAM: RIGHT SHOULDER - 2+ VIEW  COMPARISON:  None  FINDINGS: Osseous mineralization normal.  AC joint alignment normal.  No acute fracture, dislocation, or bone destruction.  IMPRESSION: Normal exam.   Electronically Signed   By: Crist Infante.D.  On: 04/16/2018 16:23    Hand Imaging: Hand-R DG Complete:  Results for orders placed during the hospital encounter of 04/16/18  DG Hand Complete Right   Narrative CLINICAL DATA:  Chronic hand pain, numbness around little finger  EXAM: RIGHT HAND - COMPLETE 3+ VIEW  COMPARISON:  None  FINDINGS: Osseous mineralization normal.  Joint spaces preserved.  Probable mild spurring at the distal radioulnar joint.  No acute fracture, dislocation, or bone destruction.  IMPRESSION: No acute osseous abnormalities.  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   Electronically Signed   By:  Lavonia Dana M.D.   On: 04/16/2018 16:24    Complexity Note: Imaging results reviewed. Results shared with Mr. Carey, using Layman's terms.                         Meds   Current Outpatient Medications:  .  aspirin EC 81 MG tablet, Take by mouth., Disp: , Rfl:  .  metFORMIN (GLUCOPHAGE) 500 MG tablet, Take by mouth 2 (two) times daily with a meal., Disp: , Rfl:  .  calcium carbonate (CALCIUM 600) 600 MG TABS tablet, Take 1 tablet (600 mg total) by mouth 2 (two) times daily with a meal., Disp: 60 tablet, Rfl: 0 .  Cholecalciferol (VITAMIN D3) 125 MCG (5000 UT) CAPS, Take 1 capsule (5,000 Units total) by mouth daily with breakfast. Take along with calcium and magnesium., Disp: 30 capsule, Rfl: 5 .  [START ON 05/08/2018] ergocalciferol (VITAMIN D2) 1.25 MG (50000 UT) capsule, Take 1 capsule (50,000 Units total) by mouth 2 (two) times a week. X 6 weeks., Disp: 12 capsule, Rfl: 0 .  Magnesium 500 MG CAPS, Take 1 capsule (500 mg total) by mouth 2 (two) times daily at 8 am and 10 pm., Disp: 60 capsule, Rfl: 5  ROS  Constitutional: Denies any fever or chills Gastrointestinal: No reported hemesis, hematochezia, vomiting, or acute GI distress Musculoskeletal: Denies any acute onset joint swelling, redness, loss of ROM, or weakness Neurological: No reported episodes of acute onset apraxia, aphasia, dysarthria, agnosia, amnesia, paralysis, loss of coordination, or loss of consciousness  Allergies  Mr. Crocket has no allergies on file.  PFSH  Drug: Mr. Coppedge  reports previous drug use. Alcohol:  reports previous alcohol use. Tobacco:  reports that he has been smoking. He has been smoking about 0.50 packs per day. He uses smokeless tobacco. Medical:  has a past medical history of Arthritis and Diabetes mellitus without complication ([place]). Surgical: Mr. Macke  has no past surgical history on file. Family: family history is not on file.  Constitutional Exam  General appearance: Well  nourished, well developed, and well hydrated. In no apparent acute distress Vitals:   05/07/18 0801 05/07/18 0804  BP:  (!) 146/78  Pulse: 75   Resp: 18   Temp: 98.4 F (36.9 C)   SpO2: 99%   Weight: 260 lb (117.9 kg)   Height: '5\' 8"'$  (1.727 m)    BMI Assessment: Estimated body mass index is 39.53 kg/m as calculated from the following:   Height as of this encounter: '5\' 8"'$  (1.727 m).   Weight as of this encounter: 260 lb (117.9 kg).  BMI interpretation table: BMI level Category Range association with higher incidence of chronic pain  <18 kg/m2 Underweight   18.5-24.9 kg/m2 Ideal body weight   25-29.9 kg/m2 Overweight Increased incidence by 20%  30-34.9 kg/m2 Obese (Class I) Increased incidence by 68%  35-39.9  kg/m2 Severe obesity (Class II) Increased incidence by 136%  >40 kg/m2 Extreme obesity (Class III) Increased incidence by 254%   Patient's current BMI Ideal Body weight  Body mass index is 39.53 kg/m. Ideal body weight: 68.4 kg (150 lb 12.7 oz) Adjusted ideal body weight: 88.2 kg (194 lb 7.6 oz)   BMI Readings from Last 4 Encounters:  05/07/18 39.53 kg/m  04/16/18 43.33 kg/m   Wt Readings from Last 4 Encounters:  05/07/18 260 lb (117.9 kg)  04/16/18 285 lb (129.3 kg)  Psych/Mental status: Alert, oriented x 3 (person, place, & time)       Eyes: PERLA Respiratory: No evidence of acute respiratory distress  Cervical Spine Area Exam  Skin & Axial Inspection: No masses, redness, edema, swelling, or associated skin lesions Alignment: Symmetrical Functional ROM: Unrestricted ROM      Stability: No instability detected Muscle Tone/Strength: Functionally intact. No obvious neuro-muscular anomalies detected. Sensory (Neurological): Unimpaired Palpation: No palpable anomalies              Upper Extremity (UE) Exam    Side: Right upper extremity  Side: Left upper extremity  Skin & Extremity Inspection: Skin color, temperature, and hair growth are WNL. No peripheral edema  or cyanosis. No masses, redness, swelling, asymmetry, or associated skin lesions. No contractures.  Skin & Extremity Inspection: Skin color, temperature, and hair growth are WNL. No peripheral edema or cyanosis. No masses, redness, swelling, asymmetry, or associated skin lesions. No contractures.  Functional ROM: Unrestricted ROM          Functional ROM: Unrestricted ROM          Muscle Tone/Strength: Functionally intact. No obvious neuro-muscular anomalies detected.  Muscle Tone/Strength: Functionally intact. No obvious neuro-muscular anomalies detected.  Sensory (Neurological): Unimpaired          Sensory (Neurological): Unimpaired          Palpation: No palpable anomalies              Palpation: No palpable anomalies              Provocative Test(s):  Phalen's test: deferred Tinel's test: deferred Apley's scratch test (touch opposite shoulder):  Action 1 (Across chest): deferred Action 2 (Overhead): deferred Action 3 (LB reach): deferred   Provocative Test(s):  Phalen's test: deferred Tinel's test: deferred Apley's scratch test (touch opposite shoulder):  Action 1 (Across chest): deferred Action 2 (Overhead): deferred Action 3 (LB reach): deferred    Thoracic Spine Area Exam  Skin & Axial Inspection: No masses, redness, or swelling Alignment: Symmetrical Functional ROM: Unrestricted ROM Stability: No instability detected Muscle Tone/Strength: Functionally intact. No obvious neuro-muscular anomalies detected. Sensory (Neurological): Unimpaired Muscle strength & Tone: No palpable anomalies  Lumbar Spine Area Exam  Skin & Axial Inspection: No masses, redness, or swelling Alignment: Symmetrical Functional ROM: Unrestricted ROM       Stability: No instability detected Muscle Tone/Strength: Functionally intact. No obvious neuro-muscular anomalies detected. Sensory (Neurological): Unimpaired Palpation: No palpable anomalies       Provocative Tests: Hyperextension/rotation test:  deferred today       Lumbar quadrant test (Kemp's test): deferred today       Lateral bending test: deferred today       Patrick's Maneuver: deferred today                   FABER* test: deferred today  S-I anterior distraction/compression test: deferred today         S-I lateral compression test: deferred today         S-I Thigh-thrust test: deferred today         S-I Gaenslen's test: deferred today         *(Flexion, ABduction and External Rotation)  Gait & Posture Assessment  Ambulation: Unassisted Gait: Relatively normal for age and body habitus Posture: WNL   Lower Extremity Exam    Side: Right lower extremity  Side: Left lower extremity  Stability: No instability observed          Stability: No instability observed          Skin & Extremity Inspection: The patient is currently wearing a brace on his right knee.  Skin & Extremity Inspection: Skin color, temperature, and hair growth are WNL. No peripheral edema or cyanosis. No masses, redness, swelling, asymmetry, or associated skin lesions. No contractures.  Functional ROM: Limited ROM for knee joint          Functional ROM: Unrestricted ROM                  Muscle Tone/Strength: Able to Toe-walk & Heel-walk without problems  Muscle Tone/Strength: Able to Toe-walk & Heel-walk without problems  Sensory (Neurological): Articular pain pattern        Sensory (Neurological): Unimpaired        DTR: Patellar: deferred today Achilles: deferred today Plantar: deferred today  DTR: Patellar: deferred today Achilles: deferred today Plantar: deferred today  Palpation: No palpable anomalies  Palpation: No palpable anomalies   Assessment & Plan  Primary Diagnosis & Pertinent Problem List: The primary encounter diagnosis was Chronic pain syndrome. Diagnoses of Chronic lower extremity pain (Primary Area of Pain) (Bilateral) (R>L), Type 2 diabetes mellitus with diabetic neuropathy, without long-term current use of insulin  ([place]), Neuropathic pain, Chronic knee pain (Secondary Area of Pain) (Right), Osteoarthritis of knee (Right), Chronic hand pain (Tertiary Area of Pain) (Right), Chronic shoulder pain (Right), Problems influencing health status, Morbid obesity with BMI of 40.0-44.9, adult ([place]), and Vitamin D deficiency were also pertinent to this visit.  Visit Diagnosis: 1. Chronic pain syndrome   2. Chronic lower extremity pain (Primary Area of Pain) (Bilateral) (R>L)   3. Type 2 diabetes mellitus with diabetic neuropathy, without long-term current use of insulin ([place])   4. Neuropathic pain   5. Chronic knee pain (Secondary Area of Pain) (Right)   6. Osteoarthritis of knee (Right)   7. Chronic hand pain [facility] Area of Pain) (Right)   8. Chronic shoulder pain (Right)   9. Problems influencing health status   10. Morbid obesity with BMI of 40.0-44.9, adult ([place])   11. Vitamin D deficiency    Problems updated and reviewed during this visit: No problems updated.  Plan of Care  Pharmacotherapy (Medications Ordered): Meds ordered this encounter  Medications  . ergocalciferol (VITAMIN D2) 1.25 MG (50000 UT) capsule    Sig: Take 1 capsule (50,000 Units total) by mouth 2 (two) times a week. X 6 weeks.    Dispense:  12 capsule    Refill:  0    Do not add this medication to the electronic "Automatic Refill" notification system. Patient may have prescription filled one day early if pharmacy is closed on scheduled refill date.  . Cholecalciferol (VITAMIN D3) 125 MCG (5000 UT) CAPS    Sig: Take 1 capsule (5,000 Units total) by mouth daily with breakfast. Take  along with calcium and magnesium.    Dispense:  30 capsule    Refill:  5    Do not place medication on "Automatic Refill".  May substitute with similar over-the-counter product.  . Magnesium 500 MG CAPS    Sig: Take 1 capsule (500 mg total) by mouth 2 (two) times daily at 8 am and 10 pm.    Dispense:  60 capsule    Refill:  5    Do not place medication  on "Automatic Refill".  The patient may use similar over-the-counter product.  . calcium carbonate (CALCIUM 600) 600 MG TABS tablet    Sig: Take 1 tablet (600 mg total) by mouth 2 (two) times daily with a meal.    Dispense:  60 tablet    Refill:  0    Do not place medication on "Automatic Refill". Fill one day early if pharmacy is closed on scheduled refill date.   Procedure Orders    No procedure(s) ordered today   Lab Orders  No laboratory test(s) ordered today   Imaging Orders  No imaging studies ordered today   Referral Orders  No referral(s) requested today   Pharmacological management options:  Opioid Analgesics: I will not be prescribing any opioids at this time Membrane stabilizer: The patient indicates having had a trial of Neurontin and Lyrica, but only the Lyrica was confirmed.  Indicated that he tried the Neurontin approximately 7 to 10 years ago.  This would explain why there is no evidence on the electronic medical record of this trial.  In addition, he indicated that the Neurontin had side effects were he experienced an upset stomach. Muscle relaxant: None prescribed at this time NSAID: None prescribed at this time Other analgesic(s): The patient indicates having tried tramadol, approximately 10 years ago, and he indicates that it did not help.   Interventional management options: Planned, scheduled, and/or pending:    None Today we will order a diagnostic bilateral EMG/PNCV to document whether or not the patient has a peripheral neuropathy.   Considering:   Diagnostic IV lidocaine infusion  Diagnostic bilateral lumbar sympathetic blocks  Possible bilateral lumbar sympathetic RFA  Diagnostic right intra-articular knee joint injection  Possible series of 5 right-sided intra-articular Hyalgan knee injections  Diagnostic right genicular nerve block  Possible right genicular nerve RFA    PRN Procedures:   None at this time   Provider-requested follow-up: Return  for F/U eval (after test completion).  Future Appointments  Date Time Provider Cyrus  06/04/2018  7:45 AM Milinda Pointer, MD [facility] None    Primary Care Physician: Thomas Hartigan, MD Location: [facility] Note by: Gaspar Cola, MD Date: 05/07/2018; Time: 9:19 AM

## 2018-05-07 ENCOUNTER — Encounter: Payer: Self-pay | Admitting: Pain Medicine

## 2018-05-07 ENCOUNTER — Ambulatory Visit: Payer: BLUE CROSS/BLUE SHIELD | Attending: Pain Medicine | Admitting: Pain Medicine

## 2018-05-07 ENCOUNTER — Other Ambulatory Visit: Payer: Self-pay

## 2018-05-07 VITALS — BP 146/78 | HR 75 | Temp 98.4°F | Resp 18 | Ht 68.0 in | Wt 260.0 lb

## 2018-05-07 DIAGNOSIS — G894 Chronic pain syndrome: Secondary | ICD-10-CM | POA: Diagnosis not present

## 2018-05-07 DIAGNOSIS — M79641 Pain in right hand: Secondary | ICD-10-CM | POA: Diagnosis not present

## 2018-05-07 DIAGNOSIS — E039 Hypothyroidism, unspecified: Secondary | ICD-10-CM | POA: Diagnosis not present

## 2018-05-07 DIAGNOSIS — M79672 Pain in left foot: Secondary | ICD-10-CM | POA: Diagnosis not present

## 2018-05-07 DIAGNOSIS — E559 Vitamin D deficiency, unspecified: Secondary | ICD-10-CM | POA: Diagnosis not present

## 2018-05-07 DIAGNOSIS — M79604 Pain in right leg: Secondary | ICD-10-CM | POA: Insufficient documentation

## 2018-05-07 DIAGNOSIS — Z6841 Body Mass Index (BMI) 40.0 and over, adult: Secondary | ICD-10-CM | POA: Insufficient documentation

## 2018-05-07 DIAGNOSIS — M79605 Pain in left leg: Secondary | ICD-10-CM | POA: Diagnosis not present

## 2018-05-07 DIAGNOSIS — M1711 Unilateral primary osteoarthritis, right knee: Secondary | ICD-10-CM

## 2018-05-07 DIAGNOSIS — Z7984 Long term (current) use of oral hypoglycemic drugs: Secondary | ICD-10-CM | POA: Diagnosis not present

## 2018-05-07 DIAGNOSIS — M79671 Pain in right foot: Secondary | ICD-10-CM | POA: Diagnosis present

## 2018-05-07 DIAGNOSIS — M25511 Pain in right shoulder: Secondary | ICD-10-CM | POA: Insufficient documentation

## 2018-05-07 DIAGNOSIS — Z79899 Other long term (current) drug therapy: Secondary | ICD-10-CM | POA: Insufficient documentation

## 2018-05-07 DIAGNOSIS — M792 Neuralgia and neuritis, unspecified: Secondary | ICD-10-CM | POA: Diagnosis not present

## 2018-05-07 DIAGNOSIS — Z7982 Long term (current) use of aspirin: Secondary | ICD-10-CM | POA: Diagnosis not present

## 2018-05-07 DIAGNOSIS — F17290 Nicotine dependence, other tobacco product, uncomplicated: Secondary | ICD-10-CM | POA: Insufficient documentation

## 2018-05-07 DIAGNOSIS — L03116 Cellulitis of left lower limb: Secondary | ICD-10-CM | POA: Diagnosis not present

## 2018-05-07 DIAGNOSIS — E1165 Type 2 diabetes mellitus with hyperglycemia: Secondary | ICD-10-CM | POA: Insufficient documentation

## 2018-05-07 DIAGNOSIS — M25561 Pain in right knee: Secondary | ICD-10-CM

## 2018-05-07 DIAGNOSIS — G8929 Other chronic pain: Secondary | ICD-10-CM

## 2018-05-07 DIAGNOSIS — Z789 Other specified health status: Secondary | ICD-10-CM

## 2018-05-07 DIAGNOSIS — E1140 Type 2 diabetes mellitus with diabetic neuropathy, unspecified: Secondary | ICD-10-CM | POA: Insufficient documentation

## 2018-05-07 MED ORDER — CALCIUM CARBONATE 600 MG PO TABS: 600.0000 mg | ORAL_TABLET | Freq: Two times a day (BID) | ORAL | 0 refills | Status: AC

## 2018-05-07 MED ORDER — MAGNESIUM 500 MG PO CAPS: 500.0000 mg | ORAL_CAPSULE | Freq: Two times a day (BID) | ORAL | 5 refills | Status: AC

## 2018-05-07 MED ORDER — VITAMIN D3 125 MCG (5000 UT) PO CAPS: 1.0000 | ORAL_CAPSULE | Freq: Every day | ORAL | 5 refills | Status: AC

## 2018-05-07 MED ORDER — ERGOCALCIFEROL 1.25 MG (50000 UT) PO CAPS: 50000.0000 [IU] | ORAL_CAPSULE | ORAL | 0 refills | Status: AC

## 2018-05-07 NOTE — Progress Notes (Signed)
Safety precautions to be maintained throughout the outpatient stay will include: orient to surroundings, keep bed in low position, maintain call bell within reach at all times, provide assistance with transfer out of bed and ambulation.  

## 2018-05-07 NOTE — Patient Instructions (Signed)

## 2018-06-01 NOTE — Progress Notes (Deleted)
Patient's Name: Thomas Benton  MRN: [id number]  Referring Provider: Sofie Hartigan, MD  DOB: Jun 10, 1975  PCP: Sofie Hartigan, MD  DOS: 06/04/2018  Note by: Gaspar Cola, MD  Service setting: Ambulatory outpatient  Specialty: Interventional Pain Management  Location: [facility] (AMB) Pain Management Facility    Patient type: Established   Primary Reason(s) for Visit: Evaluation of chronic illnesses with exacerbation, or progression (Level of risk: moderate) CC: No chief complaint on file.  HPI  Thomas Benton is a 43 y.o. year old, male patient, who comes today for a follow-up evaluation. He has Cellulitis of foot (Left); Diabetic neuropathy, type II diabetes mellitus ([place]); Hypothyroidism (acquired); Morbid obesity with BMI of 40.0-44.9, adult ([place]); Osteoarthritis of knee (Right); Type 2 diabetes mellitus without complications ([place]); Chronic lower extremity pain (Primary Area of Pain) (Bilateral) (R>L); Chronic knee pain (Secondary Area of Pain) (Right); Chronic hand pain [facility] Area of Pain) (Right); Chronic shoulder pain (Right); Chronic pain syndrome; Pharmacologic therapy; Disorder of skeletal system; Problems influencing health status; Vitamin D deficiency; and Neuropathic pain on their problem list. [name] was last seen on 05/07/2018. His primarily concern today is the No chief complaint on file.  Pain Assessment: Location:     Radiating:   Onset:   Duration:   Quality:   Severity:  /10 (subjective, self-reported pain score)  Note: Reported level is compatible with observation.                         When using our objective Pain Scale, levels between 6 and 10/10 are said to belong in an emergency room, as it progressively worsens from a 6/10, described as severely limiting, requiring emergency care not usually available at an outpatient pain management facility. At a 6/10 level, communication becomes difficult and requires great effort. Assistance to reach the emergency  department may be required. Facial flushing and profuse sweating along with potentially dangerous increases in heart rate and blood pressure will be evident. Effect on ADL:   Timing:   Modifying factors:   BP:    HR:    Further details on both, my assessment(s), as well as the proposed treatment plan, please see below.  Laboratory Chemistry  Inflammation Markers (CRP: Acute Phase) (ESR: Chronic Phase) Lab Results  Component Value Date   CRP 2 04/16/2018   ESRSEDRATE 8 04/16/2018                         Rheumatology Markers No results found for: RF, ANA, LABURIC, URICUR, LYMEIGGIGMAB, LYMEABIGMQN, HLAB27                      Renal Function Markers Lab Results  Component Value Date   BUN 11 04/16/2018   CREATININE 0.70 (L) 04/16/2018   BCR 16 04/16/2018   GFRAA 134 04/16/2018   GFRNONAA 116 04/16/2018                             Hepatic Function Markers Lab Results  Component Value Date   AST 20 04/16/2018   ALT 51 05/09/2014   ALBUMIN 4.1 04/16/2018   ALKPHOS 82 04/16/2018                        Electrolytes Lab Results  Component Value Date   NA 136 04/16/2018   K 4.7 04/16/2018  CL 101 04/16/2018   CALCIUM 9.2 04/16/2018   MG 1.8 04/16/2018                        Neuropathy Markers Lab Results  Component Value Date   WLNLGXQJ19 417 04/16/2018                        CNS Tests No results found for: COLORCSF, APPEARCSF, RBCCOUNTCSF, WBCCSF, POLYSCSF, LYMPHSCSF, EOSCSF, PROTEINCSF, GLUCCSF, JCVIRUS, CSFOLI, IGGCSF                      Bone Pathology Markers Lab Results  Component Value Date   25OHVITD1 19 (L) 04/16/2018   25OHVITD2 <1.0 04/16/2018   25OHVITD3 18 04/16/2018                         Coagulation Parameters Lab Results  Component Value Date   PLT 211 05/09/2014                        Cardiovascular Markers Lab Results  Component Value Date   TROPONINI < 0.02 08/21/2011   HGB 16.0 05/09/2014   HCT 45.9 05/09/2014                          CA Markers No results found for: CEA, CA125, LABCA2                      Note: Lab results reviewed.  Imaging Review  Cervical Imaging: Cervical MR wo contrast: No results found for this or any previous visit. Cervical MR wo contrast: No procedure found. Cervical MR w/wo contrast: No results found for this or any previous visit. Cervical MR w contrast: No results found for this or any previous visit. Cervical CT wo contrast: No results found for this or any previous visit. Cervical CT w/wo contrast: No results found for this or any previous visit. Cervical CT w/wo contrast: No results found for this or any previous visit. Cervical CT w contrast: No results found for this or any previous visit. Cervical CT outside: No results found for this or any previous visit. Cervical DG 1 view: No results found for this or any previous visit. Cervical DG 2-3 views: No results found for this or any previous visit. Cervical DG F/E views: No results found for this or any previous visit. Cervical DG 2-3 clearing views: No results found for this or any previous visit. Cervical DG Bending/F/E views: No results found for this or any previous visit. Cervical DG complete: No results found for this or any previous visit. Cervical DG Myelogram views: No results found for this or any previous visit. Cervical DG Myelogram views: No results found for this or any previous visit. Cervical Discogram views: No results found for this or any previous visit.  Shoulder Imaging: Shoulder-R MR w contrast: No results found for this or any previous visit. Shoulder-L MR w contrast: No results found for this or any previous visit. Shoulder-R MR w/wo contrast: No results found for this or any previous visit. Shoulder-L MR w/wo contrast: No results found for this or any previous visit. Shoulder-R MR wo contrast: No results found for this or any previous visit. Shoulder-L MR wo contrast: No results found for this or  any previous visit. Shoulder-R CT w contrast: No results found  for this or any previous visit. Shoulder-L CT w contrast: No results found for this or any previous visit. Shoulder-R CT w/wo contrast: No results found for this or any previous visit. Shoulder-L CT w/wo contrast: No results found for this or any previous visit. Shoulder-R CT wo contrast: No results found for this or any previous visit. Shoulder-L CT wo contrast: No results found for this or any previous visit. Shoulder-R DG Arthrogram: No results found for this or any previous visit. Shoulder-L DG Arthrogram: No results found for this or any previous visit. Shoulder-R DG 1 view: No results found for this or any previous visit. Shoulder-L DG 1 view: No results found for this or any previous visit. Shoulder-R DG:  Results for orders placed during the hospital encounter of 04/16/18  DG Shoulder Right   Narrative CLINICAL DATA:  RIGHT shoulder pain  EXAM: RIGHT SHOULDER - 2+ VIEW  COMPARISON:  None  FINDINGS: Osseous mineralization normal.  AC joint alignment normal.  No acute fracture, dislocation, or bone destruction.  IMPRESSION: Normal exam.   Electronically Signed   By: Lavonia Dana M.D.   On: 04/16/2018 16:23    Shoulder-L DG: No results found for this or any previous visit.  Thoracic Imaging: Thoracic MR wo contrast: No results found for this or any previous visit. Thoracic MR wo contrast: No procedure found. Thoracic MR w/wo contrast: No results found for this or any previous visit. Thoracic MR w contrast: No results found for this or any previous visit. Thoracic CT wo contrast: No results found for this or any previous visit. Thoracic CT w/wo contrast: No results found for this or any previous visit. Thoracic CT w/wo contrast: No results found for this or any previous visit. Thoracic CT w contrast: No results found for this or any previous visit. Thoracic DG 2-3 views: No results found for this or any  previous visit. Thoracic DG 4 views: No results found for this or any previous visit. Thoracic DG: No results found for this or any previous visit. Thoracic DG w/swimmers view: No results found for this or any previous visit. Thoracic DG Myelogram views: No results found for this or any previous visit. Thoracic DG Myelogram views: No results found for this or any previous visit.  Lumbosacral Imaging: Lumbar MR wo contrast: No results found for this or any previous visit. Lumbar MR wo contrast: No procedure found. Lumbar MR w/wo contrast: No results found for this or any previous visit. Lumbar MR w/wo contrast: No results found for this or any previous visit. Lumbar MR w contrast: No results found for this or any previous visit. Lumbar CT wo contrast: No results found for this or any previous visit. Lumbar CT w/wo contrast: No results found for this or any previous visit. Lumbar CT w/wo contrast: No results found for this or any previous visit. Lumbar CT w contrast: No results found for this or any previous visit. Lumbar DG 1V: No results found for this or any previous visit. Lumbar DG 1V (Clearing): No results found for this or any previous visit. Lumbar DG 2-3V (Clearing): No results found for this or any previous visit. Lumbar DG 2-3 views: No results found for this or any previous visit. Lumbar DG (Complete) 4+V: No results found for this or any previous visit. Lumbar DG F/E views: No results found for this or any previous visit. Lumbar DG Bending views: No results found for this or any previous visit. Lumbar DG Myelogram views: No results found for  this or any previous visit. Lumbar DG Myelogram: No results found for this or any previous visit. Lumbar DG Myelogram: No results found for this or any previous visit. Lumbar DG Myelogram: No results found for this or any previous visit. Lumbar DG Myelogram Lumbosacral: No results found for this or any previous visit. Lumbar DG Diskogram  views: No results found for this or any previous visit. Lumbar DG Diskogram views: No results found for this or any previous visit. Lumbar DG Epidurogram OP: No results found for this or any previous visit. Lumbar DG Epidurogram IP: No results found for this or any previous visit.  Sacroiliac Joint Imaging: Sacroiliac Joint DG: No results found for this or any previous visit. Sacroiliac Joint MR w/wo contrast: No results found for this or any previous visit. Sacroiliac Joint MR wo contrast: No results found for this or any previous visit.  Spine Imaging: Whole Spine DG Myelogram views: No results found for this or any previous visit. Whole Spine MR Mets screen: No results found for this or any previous visit. Whole Spine MR Mets screen: No results found for this or any previous visit. Whole Spine MR w/wo: No results found for this or any previous visit. MRA Spinal Canal w/ cm: No results found for this or any previous visit. MRA Spinal Canal wo/ cm: No procedure found. MRA Spinal Canal w/wo cm: No results found for this or any previous visit. Spine Outside MR Films: No results found for this or any previous visit. Spine Outside CT Films: No results found for this or any previous visit. CT-Guided Biopsy: No results found for this or any previous visit. CT-Guided Needle Placement: No results found for this or any previous visit. DG Spine outside: No results found for this or any previous visit. IR Spine outside: No results found for this or any previous visit. NM Spine outside: No results found for this or any previous visit.  Hip Imaging: Hip-R MR w contrast: No results found for this or any previous visit. Hip-L MR w contrast: No results found for this or any previous visit. Hip-R MR w/wo contrast: No results found for this or any previous visit. Hip-L MR w/wo contrast: No results found for this or any previous visit. Hip-R MR wo contrast: No results found for this or any previous  visit. Hip-L MR wo contrast: No results found for this or any previous visit. Hip-R CT w contrast: No results found for this or any previous visit. Hip-L CT w contrast: No results found for this or any previous visit. Hip-R CT w/wo contrast: No results found for this or any previous visit. Hip-L CT w/wo contrast: No results found for this or any previous visit. Hip-R CT wo contrast: No results found for this or any previous visit. Hip-L CT wo contrast: No results found for this or any previous visit. Hip-R DG 2-3 views: No results found for this or any previous visit. Hip-L DG 2-3 views: No results found for this or any previous visit. Hip-R DG Arthrogram: No results found for this or any previous visit. Hip-L DG Arthrogram: No results found for this or any previous visit. Hip-B DG Bilateral: No results found for this or any previous visit.  Knee Imaging: Knee-R MR w contrast: No results found for this or any previous visit. Knee-L MR w contrast: No results found for this or any previous visit. Knee-R MR w/wo contrast: No results found for this or any previous visit. Knee-L MR w/wo contrast: No results  found for this or any previous visit. Knee-R MR wo contrast: No results found for this or any previous visit. Knee-L MR wo contrast: No results found for this or any previous visit. Knee-R CT w contrast: No results found for this or any previous visit. Knee-L CT w contrast: No results found for this or any previous visit. Knee-R CT w/wo contrast: No results found for this or any previous visit. Knee-L CT w/wo contrast: No results found for this or any previous visit. Knee-R CT wo contrast: No results found for this or any previous visit. Knee-L CT wo contrast: No results found for this or any previous visit. Knee-R DG 1-2 views: No results found for this or any previous visit. Knee-L DG 1-2 views: No results found for this or any previous visit. Knee-R DG 3 views: No results found for this  or any previous visit. Knee-L DG 3 views: No results found for this or any previous visit. Knee-R DG 4 views: No results found for this or any previous visit. Knee-L DG 4 views: No results found for this or any previous visit. Knee-R DG Arthrogram: No results found for this or any previous visit. Knee-L DG Arthrogram: No results found for this or any previous visit.  Ankle Imaging: Ankle-R DG Complete: No results found for this or any previous visit. Ankle-L DG Complete: No results found for this or any previous visit.  Foot Imaging: Foot-R DG Complete: No results found for this or any previous visit. Foot-L DG Complete: No results found for this or any previous visit.  Elbow Imaging: Elbow-R DG Complete: No results found for this or any previous visit. Elbow-L DG Complete: No results found for this or any previous visit.  Wrist Imaging: Wrist-R DG Complete: No results found for this or any previous visit. Wrist-L DG Complete: No results found for this or any previous visit.  Hand Imaging: Hand-R DG Complete:  Results for orders placed during the hospital encounter of 04/16/18  DG Hand Complete Right   Narrative CLINICAL DATA:  Chronic hand pain, numbness around little finger  EXAM: RIGHT HAND - COMPLETE 3+ VIEW  COMPARISON:  None  FINDINGS: Osseous mineralization normal.  Joint spaces preserved.  Probable mild spurring at the distal radioulnar joint.  No acute fracture, dislocation, or bone destruction.  IMPRESSION: No acute osseous abnormalities.  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   Electronically Signed   By: Lavonia Dana M.D.   On: 04/16/2018 16:24    Hand-L DG Complete: No results found for this or any previous visit.  Complexity Note: Imaging results reviewed. Results shared with Mr. Rode, using Layman's terms.                         Meds   Current Outpatient Medications:  .  aspirin EC 81 MG tablet, Take by  mouth., Disp: , Rfl:  .  calcium carbonate (CALCIUM 600) 600 MG TABS tablet, Take 1 tablet (600 mg total) by mouth 2 (two) times daily with a meal., Disp: 60 tablet, Rfl: 0 .  Cholecalciferol (VITAMIN D3) 125 MCG (5000 UT) CAPS, Take 1 capsule (5,000 Units total) by mouth daily with breakfast. Take along with calcium and magnesium., Disp: 30 capsule, Rfl: 5 .  ergocalciferol (VITAMIN D2) 1.25 MG (50000 UT) capsule, Take 1 capsule (50,000 Units total) by mouth 2 (two) times a week. X 6 weeks., Disp: 12 capsule, Rfl: 0 .  Magnesium 500 MG  CAPS, Take 1 capsule (500 mg total) by mouth 2 (two) times daily at 8 am and 10 pm., Disp: 60 capsule, Rfl: 5 .  metFORMIN (GLUCOPHAGE) 500 MG tablet, Take by mouth 2 (two) times daily with a meal., Disp: , Rfl:   ROS  Constitutional: Denies any fever or chills Gastrointestinal: No reported hemesis, hematochezia, vomiting, or acute GI distress Musculoskeletal: Denies any acute onset joint swelling, redness, loss of ROM, or weakness Neurological: No reported episodes of acute onset apraxia, aphasia, dysarthria, agnosia, amnesia, paralysis, loss of coordination, or loss of consciousness  Allergies  Mr. Cordner has no allergies on file.  PFSH  Drug: Mr. Hartlage  reports previous drug use. Alcohol:  reports previous alcohol use. Tobacco:  reports that he has been smoking. He has been smoking about 0.50 packs per day. He uses smokeless tobacco. Medical:  has a past medical history of Arthritis and Diabetes mellitus without complication ([place]). Surgical: Mr. Westberg  has no past surgical history on file. Family: family history is not on file.  Constitutional Exam  General appearance: Well nourished, well developed, and well hydrated. In no apparent acute distress There were no vitals filed for this visit. BMI Assessment: Estimated body mass index is 39.53 kg/m as calculated from the following:   Height as of 05/07/18: _0  (1.727 m).   Weight as of 05/07/18:  260 lb (117.9 kg).  BMI interpretation table: BMI level Category Range association with higher incidence of chronic pain  <18 kg/m2 Underweight   18.5-24.9 kg/m2 Ideal body weight   25-29.9 kg/m2 Overweight Increased incidence by 20%  30-34.9 kg/m2 Obese (Class I) Increased incidence by 68%  35-39.9 kg/m2 Severe obesity (Class II) Increased incidence by 136%  >40 kg/m2 Extreme obesity (Class III) Increased incidence by 254%   Patient's current BMI Ideal Body weight  There is no height or weight on file to calculate BMI. Patient weight not recorded   BMI Readings from Last 4 Encounters:  05/07/18 39.53 kg/m  04/16/18 43.33 kg/m   Wt Readings from Last 4 Encounters:  05/07/18 260 lb (117.9 kg)  04/16/18 285 lb (129.3 kg)  Psych/Mental status: Alert, oriented x 3 (person, place, & time)       Eyes: PERLA Respiratory: No evidence of acute respiratory distress  Cervical Spine Area Exam  Skin & Axial Inspection: No masses, redness, edema, swelling, or associated skin lesions Alignment: Symmetrical Functional ROM: Unrestricted ROM      Stability: No instability detected Muscle Tone/Strength: Functionally intact. No obvious neuro-muscular anomalies detected. Sensory (Neurological): Unimpaired Palpation: No palpable anomalies              Upper Extremity (UE) Exam    Side: Right upper extremity  Side: Left upper extremity  Skin & Extremity Inspection: Skin color, temperature, and hair growth are WNL. No peripheral edema or cyanosis. No masses, redness, swelling, asymmetry, or associated skin lesions. No contractures.  Skin & Extremity Inspection: Skin color, temperature, and hair growth are WNL. No peripheral edema or cyanosis. No masses, redness, swelling, asymmetry, or associated skin lesions. No contractures.  Functional ROM: Unrestricted ROM          Functional ROM: Unrestricted ROM          Muscle Tone/Strength: Functionally intact. No obvious neuro-muscular anomalies detected.   Muscle Tone/Strength: Functionally intact. No obvious neuro-muscular anomalies detected.  Sensory (Neurological): Unimpaired          Sensory (Neurological): Unimpaired  Palpation: No palpable anomalies              Palpation: No palpable anomalies              Provocative Test(s):  Phalen's test: deferred Tinel's test: deferred Apley's scratch test (touch opposite shoulder):  Action 1 (Across chest): deferred Action 2 (Overhead): deferred Action 3 (LB reach): deferred   Provocative Test(s):  Phalen's test: deferred Tinel's test: deferred Apley's scratch test (touch opposite shoulder):  Action 1 (Across chest): deferred Action 2 (Overhead): deferred Action 3 (LB reach): deferred    Thoracic Spine Area Exam  Skin & Axial Inspection: No masses, redness, or swelling Alignment: Symmetrical Functional ROM: Unrestricted ROM Stability: No instability detected Muscle Tone/Strength: Functionally intact. No obvious neuro-muscular anomalies detected. Sensory (Neurological): Unimpaired Muscle strength & Tone: No palpable anomalies  Lumbar Spine Area Exam  Skin & Axial Inspection: No masses, redness, or swelling Alignment: Symmetrical Functional ROM: Unrestricted ROM       Stability: No instability detected Muscle Tone/Strength: Functionally intact. No obvious neuro-muscular anomalies detected. Sensory (Neurological): Unimpaired Palpation: No palpable anomalies       Provocative Tests: Hyperextension/rotation test: deferred today       Lumbar quadrant test (Kemp's test): deferred today       Lateral bending test: deferred today       Patrick's Maneuver: deferred today                   FABER* test: deferred today                   S-I anterior distraction/compression test: deferred today         S-I lateral compression test: deferred today         S-I Thigh-thrust test: deferred today         S-I Gaenslen's test: deferred today         *(Flexion, ABduction and External  Rotation)  Gait & Posture Assessment  Ambulation: Unassisted Gait: Relatively normal for age and body habitus Posture: WNL   Lower Extremity Exam    Side: Right lower extremity  Side: Left lower extremity  Stability: No instability observed          Stability: No instability observed          Skin & Extremity Inspection: Skin color, temperature, and hair growth are WNL. No peripheral edema or cyanosis. No masses, redness, swelling, asymmetry, or associated skin lesions. No contractures.  Skin & Extremity Inspection: Skin color, temperature, and hair growth are WNL. No peripheral edema or cyanosis. No masses, redness, swelling, asymmetry, or associated skin lesions. No contractures.  Functional ROM: Unrestricted ROM                  Functional ROM: Unrestricted ROM                  Muscle Tone/Strength: Functionally intact. No obvious neuro-muscular anomalies detected.  Muscle Tone/Strength: Functionally intact. No obvious neuro-muscular anomalies detected.  Sensory (Neurological): Unimpaired        Sensory (Neurological): Unimpaired        DTR: Patellar: deferred today Achilles: deferred today Plantar: deferred today  DTR: Patellar: deferred today Achilles: deferred today Plantar: deferred today  Palpation: No palpable anomalies  Palpation: No palpable anomalies   Assessment  Primary Diagnosis & Pertinent Problem List: There were no encounter diagnoses.  Status Diagnosis  Controlled Controlled Controlled No diagnosis found.  Problems updated and reviewed during this  visit: No problems updated. Plan of Care  Pharmacotherapy (Medications Ordered): No orders of the defined types were placed in this encounter.  Medications administered today: Gardenia Phlegm had no medications administered during this visit.  Procedure Orders    No procedure(s) ordered today   Lab Orders  No laboratory test(s) ordered today   Imaging Orders  No imaging studies ordered today   Referral  Orders  No referral(s) requested today   Interventional management options: Planned, scheduled, and/or pending:   ***   Considering:   ***   Palliative PRN treatment(s):   None at this time   Provider-requested follow-up: No follow-ups on file.  Future Appointments  Date Time Provider [place]  06/04/2018  7:45 AM Milinda Pointer, MD [facility] None   Primary Care Physician: Sofie Hartigan, MD Location: [facility] Note by: Gaspar Cola, MD Date: 06/04/2018; Time: 7:22 PM

## 2018-06-04 ENCOUNTER — Ambulatory Visit: Payer: BLUE CROSS/BLUE SHIELD | Admitting: Pain Medicine

## 2018-06-12 NOTE — Progress Notes (Signed)
Patient's Name: Thomas Benton  MRN: [id number]  Referring Provider: Sofie Hartigan, MD  DOB: 1976-05-18  PCP: Sofie Hartigan, MD  DOS: 06/16/2018  Note by: Gaspar Cola, MD  Service setting: Ambulatory outpatient  Specialty: Interventional Pain Management  Location: [facility] (AMB) Pain Management Facility    Patient type: Established   Primary Reason(s) for Visit: Evaluation of chronic illnesses with exacerbation, or progression (Level of risk: moderate) CC: Leg Pain (bilateral) and Foot Pain (bilateral)  HPI  Thomas Benton is a 43 y.o. year old, male patient, who comes today for a follow-up evaluation. He has Cellulitis of foot (Left); Diabetic neuropathy, type II diabetes mellitus ([place]); Hypothyroidism (acquired); Morbid obesity with BMI of 40.0-44.9, adult ([place]); Osteoarthritis of knee (Right); Type 2 diabetes mellitus without complications ([place]); Chronic lower extremity pain (Primary Area of Pain) (Bilateral) (R>L); Chronic knee pain (Secondary Area of Pain) (Right); Chronic hand pain [facility] Area of Pain) (Right); Chronic shoulder pain (Right); Chronic pain syndrome; Pharmacologic therapy; Disorder of skeletal system; Problems influencing health status; Vitamin D deficiency; and Neuropathic pain on their problem list. Thomas Benton was last seen on 05/07/2018. His primarily concern today is the Leg Pain (bilateral) and Foot Pain (bilateral)  Pain Assessment: Location: Right, Left Leg Radiating: sometimes radiates into feet Onset: More than a month ago Duration: Chronic pain Quality: Throbbing, Stabbing Severity: 9 /10 (subjective, self-reported pain score)  Note: Reported level is inconsistent with clinical observations. Clinically the patient looks like a 2/10 A 2/10 is viewed as "Mild to Moderate" and described as noticeable and distracting. Impossible to hide from other people. More frequent flare-ups. Still possible to adapt and function close to normal. It can be very annoying and  may have occasional stronger flare-ups. With discipline, patients may get used to it and adapt. Information on the proper use of the pain scale provided to the patient today. When using our objective Pain Scale, levels between 6 and 10/10 are said to belong in an emergency room, as it progressively worsens from a 6/10, described as severely limiting, requiring emergency care not usually available at an outpatient pain management facility. At a 6/10 level, communication becomes difficult and requires great effort. Assistance to reach the emergency department may be required. Facial flushing and profuse sweating along with potentially dangerous increases in heart rate and blood pressure will be evident. Effect on ADL: "It is starting to affect my work" Timing: Constant Modifying factors: nothing BP: 134/78  HR: 82  Primary area of pain is in his legs (B) (R>L).Going on for approximately 10 years. Feels that it is related to peripheral neuropathy from diabetes. He returns today to go over the results of the nerve conduction test.  These results would indicate that the patient does not have a radiculopathy or a peripheral neuropathy.  He previously indicated that he has failed Lyrica, tramadol, and gabapentin.   Pregabalin (Lyrica): Trial around 01/11/2017. 50 mg tid. SE/AR maybe some sleepiness.   Tramadol (Ultram): One of the first things they tried about 10 yrs ago.   Gabapentin (Neurontin): Tried it in the last 7-10 years and indicated that it gave him an upset stomach and did not help with the pain.   His second area pain is in his right knee (R) (old injury where he fell off a ladder).  His third area pain is in his right hand (R) affecting fingers 3,4 and 5.  On the last visit, we referred him to physical therapy.  Today I went  over the results of his nerve conduction test as well as his x-rays.  I have gone over the results with the patient and explained them in layman's terms.   I could not find any significant pathology and therefore I do not find anything that would justify the use of any strong analgesic opioids.  In an effort to see if there is something that I can offer him, I have scheduled him to return for an IV lidocaine infusion.  Should he get good benefit from this, we could consider putting him on mexiletine.  Further details on both, my assessment(s), as well as the proposed treatment plan, please see below.  Laboratory Chemistry  Inflammation Markers (CRP: Acute Phase) (ESR: Chronic Phase) Lab Results  Component Value Date   CRP 2 04/16/2018   ESRSEDRATE 8 04/16/2018                         Rheumatology Markers No results found.  Renal Function Markers Lab Results  Component Value Date   BUN 11 04/16/2018   CREATININE 0.70 (L) 04/16/2018   BCR 16 04/16/2018   GFRAA 134 04/16/2018   GFRNONAA 116 04/16/2018                             Hepatic Function Markers Lab Results  Component Value Date   AST 20 04/16/2018   ALT 51 05/09/2014   ALBUMIN 4.1 04/16/2018   ALKPHOS 82 04/16/2018                        Electrolytes Lab Results  Component Value Date   NA 136 04/16/2018   K 4.7 04/16/2018   CL 101 04/16/2018   CALCIUM 9.2 04/16/2018   MG 1.8 04/16/2018                        Neuropathy Markers Lab Results  Component Value Date   VITAMINB12 599 04/16/2018                        CNS Tests No results found.  Bone Pathology Markers Lab Results  Component Value Date   25OHVITD1 19 (L) 04/16/2018   25OHVITD2 <1.0 04/16/2018   25OHVITD3 18 04/16/2018                         Coagulation Parameters Lab Results  Component Value Date   PLT 211 05/09/2014                        Cardiovascular Markers Lab Results  Component Value Date   TROPONINI < 0.02 08/21/2011   HGB 16.0 05/09/2014   HCT 45.9 05/09/2014                         CA Markers No results found.  Note: Lab results reviewed.  Imaging Review   Shoulder Imaging: Shoulder-R DG:  Results for orders placed during the hospital encounter of 04/16/18  DG Shoulder Right   Narrative CLINICAL DATA:  RIGHT shoulder pain  EXAM: RIGHT SHOULDER - 2+ VIEW  COMPARISON:  None  FINDINGS: Osseous mineralization normal.  AC joint alignment normal.  No acute fracture, dislocation, or bone destruction.  IMPRESSION: Normal exam.   Electronically Signed   By: Elta Guadeloupe  Thornton Papas M.D.   On: 04/16/2018 16:23    Hand Imaging: Hand-R DG Complete:  Results for orders placed during the hospital encounter of 04/16/18  DG Hand Complete Right   Narrative CLINICAL DATA:  Chronic hand pain, numbness around little finger  EXAM: RIGHT HAND - COMPLETE 3+ VIEW  COMPARISON:  None  FINDINGS: Osseous mineralization normal.  Joint spaces preserved.  Probable mild spurring at the distal radioulnar joint.  No acute fracture, dislocation, or bone destruction.  IMPRESSION: No acute osseous abnormalities.  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   Electronically Signed   By: Lavonia Dana M.D.   On: 04/16/2018 16:24    Complexity Note: Imaging results reviewed. Results shared with Mr. Trowbridge, using Layman's terms.                         Meds   Current Outpatient Medications:  .  aspirin EC 81 MG tablet, Take by mouth., Disp: , Rfl:  .  calcium carbonate (CALCIUM 600) 600 MG TABS tablet, Take 1 tablet (600 mg total) by mouth 2 (two) times daily with a meal., Disp: 60 tablet, Rfl: 0 .  Cholecalciferol (VITAMIN D3) 125 MCG (5000 UT) CAPS, Take 1 capsule (5,000 Units total) by mouth daily with breakfast. Take along with calcium and magnesium., Disp: 30 capsule, Rfl: 5 .  ergocalciferol (VITAMIN D2) 1.25 MG (50000 UT) capsule, Take 1 capsule (50,000 Units total) by mouth 2 (two) times a week. X 6 weeks., Disp: 12 capsule, Rfl: 0 .  Magnesium 500 MG CAPS, Take 1 capsule (500 mg total) by mouth 2 (two) times daily at 8 am  and 10 pm., Disp: 60 capsule, Rfl: 5 .  metFORMIN (GLUCOPHAGE) 500 MG tablet, Take by mouth 2 (two) times daily with a meal., Disp: , Rfl:   ROS  Constitutional: Denies any fever or chills Gastrointestinal: No reported hemesis, hematochezia, vomiting, or acute GI distress Musculoskeletal: Denies any acute onset joint swelling, redness, loss of ROM, or weakness Neurological: No reported episodes of acute onset apraxia, aphasia, dysarthria, agnosia, amnesia, paralysis, loss of coordination, or loss of consciousness  Allergies  Mr. Passey has no allergies on file.  PFSH  Drug: Mr. Sahli  reports previous drug use. Alcohol:  reports previous alcohol use. Tobacco:  reports that he has been smoking. He has been smoking about 0.50 packs per day. He uses smokeless tobacco. Medical:  has a past medical history of Arthritis and Diabetes mellitus without complication ([place]). Surgical: Mr. Boney  has no past surgical history on file. Family: family history includes Diabetes in his mother; Heart disease in his father.  Constitutional Exam  General appearance: Well nourished, well developed, and well hydrated. In no apparent acute distress Vitals:   06/16/18 0824  BP: 134/78  Pulse: 82  Resp: 18  Temp: 98.4 F (36.9 C)  SpO2: 99%  Weight: 270 lb (122.5 kg)  Height: '5\' 8"'  (1.727 m)   BMI Assessment: Estimated body mass index is 41.05 kg/m as calculated from the following:   Height as of this encounter: '5\' 8"'  (1.727 m).   Weight as of this encounter: 270 lb (122.5 kg).  BMI interpretation table: BMI level Category Range association with higher incidence of chronic pain  <18 kg/m2 Underweight   18.5-24.9 kg/m2 Ideal body weight   25-29.9 kg/m2 Overweight Increased incidence by 20%  30-34.9 kg/m2 Obese (Class I) Increased incidence by 68%  35-39.9 kg/m2 Severe obesity (Class  II) Increased incidence by 136%  >40 kg/m2 Extreme obesity (Class III) Increased incidence by 254%   Patient's  current BMI Ideal Body weight  Body mass index is 41.05 kg/m. Ideal body weight: 68.4 kg (150 lb 12.7 oz) Adjusted ideal body weight: 90 kg (198 lb 7.6 oz)   BMI Readings from Last 4 Encounters:  06/16/18 41.05 kg/m  05/07/18 39.53 kg/m  04/16/18 43.33 kg/m   Wt Readings from Last 4 Encounters:  06/16/18 270 lb (122.5 kg)  05/07/18 260 lb (117.9 kg)  04/16/18 285 lb (129.3 kg)  Psych/Mental status: Alert, oriented x 3 (person, place, & time)       Eyes: PERLA Respiratory: No evidence of acute respiratory distress  Cervical Spine Area Exam  Skin & Axial Inspection: No masses, redness, edema, swelling, or associated skin lesions Alignment: Symmetrical Functional ROM: Unrestricted ROM      Stability: No instability detected Muscle Tone/Strength: Functionally intact. No obvious neuro-muscular anomalies detected. Sensory (Neurological): Unimpaired Palpation: No palpable anomalies              Upper Extremity (UE) Exam    Side: Right upper extremity  Side: Left upper extremity  Skin & Extremity Inspection: Skin color, temperature, and hair growth are WNL. No peripheral edema or cyanosis. No masses, redness, swelling, asymmetry, or associated skin lesions. No contractures.  Skin & Extremity Inspection: Skin color, temperature, and hair growth are WNL. No peripheral edema or cyanosis. No masses, redness, swelling, asymmetry, or associated skin lesions. No contractures.  Functional ROM: Unrestricted ROM          Functional ROM: Unrestricted ROM          Muscle Tone/Strength: Functionally intact. No obvious neuro-muscular anomalies detected.  Muscle Tone/Strength: Functionally intact. No obvious neuro-muscular anomalies detected.  Sensory (Neurological): Unimpaired          Sensory (Neurological): Unimpaired          Palpation: No palpable anomalies              Palpation: No palpable anomalies              Provocative Test(s):  Phalen's test: deferred Tinel's test: deferred Apley's  scratch test (touch opposite shoulder):  Action 1 (Across chest): deferred Action 2 (Overhead): deferred Action 3 (LB reach): deferred   Provocative Test(s):  Phalen's test: deferred Tinel's test: deferred Apley's scratch test (touch opposite shoulder):  Action 1 (Across chest): deferred Action 2 (Overhead): deferred Action 3 (LB reach): deferred    Thoracic Spine Area Exam  Skin & Axial Inspection: No masses, redness, or swelling Alignment: Symmetrical Functional ROM: Unrestricted ROM Stability: No instability detected Muscle Tone/Strength: Functionally intact. No obvious neuro-muscular anomalies detected. Sensory (Neurological): Unimpaired Muscle strength & Tone: No palpable anomalies  Lumbar Spine Area Exam  Skin & Axial Inspection: No masses, redness, or swelling Alignment: Symmetrical Functional ROM: Unrestricted ROM       Stability: No instability detected Muscle Tone/Strength: Functionally intact. No obvious neuro-muscular anomalies detected. Sensory (Neurological): Unimpaired Palpation: No palpable anomalies       Provocative Tests: Hyperextension/rotation test: deferred today       Lumbar quadrant test (Kemp's test): deferred today       Lateral bending test: deferred today       Patrick's Maneuver: deferred today                   FABER* test: deferred today  S-I anterior distraction/compression test: deferred today         S-I lateral compression test: deferred today         S-I Thigh-thrust test: deferred today         S-I Gaenslen's test: deferred today         *(Flexion, ABduction and External Rotation)  Gait & Posture Assessment  Ambulation: Unassisted Gait: Relatively normal for age and body habitus Posture: WNL   Lower Extremity Exam    Side: Right lower extremity  Side: Left lower extremity  Stability: No instability observed          Stability: No instability observed          Skin & Extremity Inspection: Right knee brace.  Skin &  Extremity Inspection: Skin color, temperature, and hair growth are WNL. No peripheral edema or cyanosis. No masses, redness, swelling, asymmetry, or associated skin lesions. No contractures.  Functional ROM: Unrestricted ROM                  Functional ROM: Unrestricted ROM                  Muscle Tone/Strength: Functionally intact. No obvious neuro-muscular anomalies detected.  Muscle Tone/Strength: Functionally intact. No obvious neuro-muscular anomalies detected.  Sensory (Neurological): Unimpaired        Sensory (Neurological): Unimpaired        DTR: Patellar: deferred today Achilles: deferred today Plantar: deferred today  DTR: Patellar: deferred today Achilles: deferred today Plantar: deferred today  Palpation: No palpable anomalies  Palpation: No palpable anomalies   Assessment  Primary Diagnosis & Pertinent Problem List: The primary encounter diagnosis was Chronic pain syndrome. Diagnoses of Chronic lower extremity pain (Primary Area of Pain) (Bilateral) (R>L), Chronic knee pain (Secondary Area of Pain) (Right), Chronic hand pain (Tertiary Area of Pain) (Right), Chronic shoulder pain (Right), Osteoarthritis of knee (Right), Neuropathic pain, Type 2 diabetes mellitus with diabetic neuropathy, unspecified whether long term insulin use ([place]), Cellulitis of foot (Left), Disorder of skeletal system, Pharmacologic therapy, Problems influencing health status, Morbid obesity with BMI of 40.0-44.9, adult ([place]), and Vitamin D deficiency were also pertinent to this visit.  Status Diagnosis  Controlled Controlled Controlled 1. Chronic pain syndrome   2. Chronic lower extremity pain (Primary Area of Pain) (Bilateral) (R>L)   3. Chronic knee pain (Secondary Area of Pain) (Right)   4. Chronic hand pain [facility] Area of Pain) (Right)   5. Chronic shoulder pain (Right)   6. Osteoarthritis of knee (Right)   7. Neuropathic pain   8. Type 2 diabetes mellitus with diabetic neuropathy, unspecified  whether long term insulin use ([place])   9. Cellulitis of foot (Left)   10. Disorder of skeletal system   11. Pharmacologic therapy   12. Problems influencing health status   13. Morbid obesity with BMI of 40.0-44.9, adult ([place])   14. Vitamin D deficiency     Problems updated and reviewed during this visit: No problems updated. Plan of Care  Pharmacotherapy (Medications Ordered): No orders of the defined types were placed in this encounter.  Medications administered today: [place]ia Phlegm had no medications administered during this visit.   Procedure Orders     LIDOCAINE INFUSION Lab Orders  No laboratory test(s) ordered today   Imaging Orders  No imaging studies ordered today   Referral Orders  No referral(s) requested today   Interventional management options: Planned, scheduled, and/or pending:   Bilateral EMG/PNCV done - WNL Diagnostic IV  lidocaine infusion #1   Considering:   Diagnostic IV lidocaine infusion  Diagnostic bilateral lumbar sympathetic blocks  Possible bilateral lumbar sympathetic RFA  Diagnostic right intra-articular knee joint injection  Possible series of 5 right-sided intra-articular Hyalgan knee injections  Diagnostic right genicular nerve block  Possible right genicular nerve RFA    Palliative PRN treatment(s):   None at this time   Provider-requested follow-up: Return for Procedure (w/ sedation): IV Lidocaine infusion.  Future Appointments  Date Time Provider [place]  07/01/2018  8:00 AM Milinda Pointer, MD [facility] None   Primary Care Physician: Sofie Hartigan, MD Location: [facility] Note by: Gaspar Cola, MD Date: 06/16/2018; Time: 9:39 AM

## 2018-06-16 ENCOUNTER — Ambulatory Visit: Payer: BLUE CROSS/BLUE SHIELD | Attending: Pain Medicine | Admitting: Pain Medicine

## 2018-06-16 ENCOUNTER — Encounter: Payer: Self-pay | Admitting: Pain Medicine

## 2018-06-16 ENCOUNTER — Other Ambulatory Visit: Payer: Self-pay

## 2018-06-16 VITALS — BP 134/78 | HR 82 | Temp 98.4°F | Resp 18 | Ht 68.0 in | Wt 270.0 lb

## 2018-06-16 DIAGNOSIS — M25511 Pain in right shoulder: Secondary | ICD-10-CM | POA: Insufficient documentation

## 2018-06-16 DIAGNOSIS — M25561 Pain in right knee: Secondary | ICD-10-CM | POA: Insufficient documentation

## 2018-06-16 DIAGNOSIS — L03116 Cellulitis of left lower limb: Secondary | ICD-10-CM | POA: Insufficient documentation

## 2018-06-16 DIAGNOSIS — G894 Chronic pain syndrome: Secondary | ICD-10-CM | POA: Insufficient documentation

## 2018-06-16 DIAGNOSIS — M79641 Pain in right hand: Secondary | ICD-10-CM | POA: Diagnosis present

## 2018-06-16 DIAGNOSIS — M79605 Pain in left leg: Secondary | ICD-10-CM

## 2018-06-16 DIAGNOSIS — E1140 Type 2 diabetes mellitus with diabetic neuropathy, unspecified: Secondary | ICD-10-CM | POA: Insufficient documentation

## 2018-06-16 DIAGNOSIS — M792 Neuralgia and neuritis, unspecified: Secondary | ICD-10-CM | POA: Diagnosis present

## 2018-06-16 DIAGNOSIS — Z789 Other specified health status: Secondary | ICD-10-CM | POA: Diagnosis present

## 2018-06-16 DIAGNOSIS — E559 Vitamin D deficiency, unspecified: Secondary | ICD-10-CM | POA: Insufficient documentation

## 2018-06-16 DIAGNOSIS — M79604 Pain in right leg: Secondary | ICD-10-CM | POA: Diagnosis present

## 2018-06-16 DIAGNOSIS — G8929 Other chronic pain: Secondary | ICD-10-CM | POA: Diagnosis present

## 2018-06-16 DIAGNOSIS — M1711 Unilateral primary osteoarthritis, right knee: Secondary | ICD-10-CM | POA: Insufficient documentation

## 2018-06-16 DIAGNOSIS — M899 Disorder of bone, unspecified: Secondary | ICD-10-CM | POA: Diagnosis present

## 2018-06-16 DIAGNOSIS — Z79899 Other long term (current) drug therapy: Secondary | ICD-10-CM | POA: Insufficient documentation

## 2018-06-16 DIAGNOSIS — Z6841 Body Mass Index (BMI) 40.0 and over, adult: Secondary | ICD-10-CM | POA: Insufficient documentation

## 2018-06-16 NOTE — Patient Instructions (Addendum)
______________________________________________________________________________________________  Specialty Pain Scale  Introduction:  There are significant differences in how pain is reported. The word pain usually refers to physical pain, but it is also a common synonym of suffering. The medical community uses a scale from 0 (zero) to 10 (ten) to report pain level. Zero (0) is described as "no pain", while ten (10) is described as "the worse pain you can imagine". The problem with this scale is that physical pain is reported along with suffering. Suffering refers to mental pain, or more often yet it refers to any unpleasant feeling, emotion or aversion associated with the perception of harm or threat of harm. It is the psychological component of pain.  Pain Specialists prefer to separate the two components. The pain scale used by this practice is the Verbal Numerical Rating Scale (VNRS-11). This scale is for the physical pain only. DO NOT INCLUDE how your pain psychologically affects you. This scale is for adults 21 years of age and older. It has 11 (eleven) levels. The 1st level is 0/10. This means: "right now, I have no pain". In the context of pain management, it also means: "right now, my physical pain is under control with the current therapy".  General Information:  The scale should reflect your current level of pain. Unless you are specifically asked for the level of your worst pain, or your average pain. If you are asked for one of these two, then it should be understood that it is over the past 24 hours.  Levels 1 (one) through 5 (five) are described below, and can be treated as an outpatient. Ambulatory pain management facilities such as ours are more than adequate to treat these levels. Levels 6 (six) through 10 (ten) are also described below, however, these must be treated as a hospitalized patient. While levels 6 (six) and 7 (seven) may be evaluated at an urgent care facility, levels 8  (eight) through 10 (ten) constitute medical emergencies and as such, they belong in a hospital's emergency department. When having these levels (as described below), do not come to our office. Our facility is not equipped to manage these levels. Go directly to an urgent care facility or an emergency department to be evaluated.  Definitions:  Activities of Daily Living (ADL): Activities of daily living (ADL or ADLs) is a term used in healthcare to refer to people's daily self-care activities. Health professionals often use a person's ability or inability to perform ADLs as a measurement of their functional status, particularly in regard to people post injury, with disabilities and the elderly. There are two ADL levels: Basic and Instrumental. Basic Activities of Daily Living (BADL  or BADLs) consist of self-care tasks that include: Bathing and showering; personal hygiene and grooming (including brushing/combing/styling hair); dressing; Toilet hygiene (getting to the toilet, cleaning oneself, and getting back up); eating and self-feeding (not including cooking or chewing and swallowing); functional mobility, often referred to as "transferring", as measured by the ability to walk, get in and out of bed, and get into and out of a chair; the broader definition (moving from one place to another while performing activities) is useful for people with different physical abilities who are still able to get around independently. Basic ADLs include the things many people do when they get up in the morning and get ready to go out of the house: get out of bed, go to the toilet, bathe, dress, groom, and eat. On the average, loss of function typically follows a particular order.   Hygiene is the first to go, followed by loss of toilet use and locomotion. The last to go is the ability to eat. When there is only one remaining area in which the person is independent, there is a 62.9% chance that it is eating and only a 3.5% chance  that it is hygiene. Instrumental Activities of Daily Living (IADL or IADLs) are not necessary for fundamental functioning, but they let an individual live independently in a community. IADL consist of tasks that include: cleaning and maintaining the house; home establishment and maintenance; care of others (including selecting and supervising caregivers); care of pets; child rearing; managing money; managing financials (investments, etc.); meal preparation and cleanup; shopping for groceries and necessities; moving within the community; safety procedures and emergency responses; health management and maintenance (taking prescribed medications); and using the telephone or other form of communication.  Instructions:  Most patients tend to report their pain as a combination of two factors, their physical pain and their psychosocial pain. This last one is also known as "suffering" and it is reflection of how physical pain affects you socially and psychologically. From now on, report them separately.  From this point on, when asked to report your pain level, report only your physical pain. Use the following table for reference.  Pain Clinic Pain Levels (0-5/10)  Pain Level Score  Description  No Pain 0   Mild pain 1 Nagging, annoying, but does not interfere with basic activities of daily living (ADL). Patients are able to eat, bathe, get dressed, toileting (being able to get on and off the toilet and perform personal hygiene functions), transfer (move in and out of bed or a chair without assistance), and maintain continence (able to control bladder and bowel functions). Blood pressure and heart rate are unaffected. A normal heart rate for a healthy adult ranges from 60 to 100 bpm (beats per minute).   Mild to moderate pain 2 Noticeable and distracting. Impossible to hide from other people. More frequent flare-ups. Still possible to adapt and function close to normal. It can be very annoying and may have  occasional stronger flare-ups. With discipline, patients may get used to it and adapt.   Moderate pain 3 Interferes significantly with activities of daily living (ADL). It becomes difficult to feed, bathe, get dressed, get on and off the toilet or to perform personal hygiene functions. Difficult to get in and out of bed or a chair without assistance. Very distracting. With effort, it can be ignored when deeply involved in activities.   Moderately severe pain 4 Impossible to ignore for more than a few minutes. With effort, patients may still be able to manage work or participate in some social activities. Very difficult to concentrate. Signs of autonomic nervous system discharge are evident: dilated pupils (mydriasis); mild sweating (diaphoresis); sleep interference. Heart rate becomes elevated (>115 bpm). Diastolic blood pressure (lower number) rises above 100 mmHg. Patients find relief in laying down and not moving.   Severe pain 5 Intense and extremely unpleasant. Associated with frowning face and frequent crying. Pain overwhelms the senses.  Ability to do any activity or maintain social relationships becomes significantly limited. Conversation becomes difficult. Pacing back and forth is common, as getting into a comfortable position is nearly impossible. Pain wakes you up from deep sleep. Physical signs will be obvious: pupillary dilation; increased sweating; goosebumps; brisk reflexes; cold, clammy hands and feet; nausea, vomiting or dry heaves; loss of appetite; significant sleep disturbance with inability to fall asleep or to   remain asleep. When persistent, significant weight loss is observed due to the complete loss of appetite and sleep deprivation.  Blood pressure and heart rate becomes significantly elevated. Caution: If elevated blood pressure triggers a pounding headache associated with blurred vision, then the patient should immediately seek attention at an urgent or emergency care unit, as  these may be signs of an impending stroke.    Emergency Department Pain Levels (6-10/10)  Emergency Room Pain 6 Severely limiting. Requires emergency care and should not be seen or managed at an outpatient pain management facility. Communication becomes difficult and requires great effort. Assistance to reach the emergency department may be required. Facial flushing and profuse sweating along with potentially dangerous increases in heart rate and blood pressure will be evident.   Distressing pain 7 Self-care is very difficult. Assistance is required to transport, or use restroom. Assistance to reach the emergency department will be required. Tasks requiring coordination, such as bathing and getting dressed become very difficult.   Disabling pain 8 Self-care is no longer possible. At this level, pain is disabling. The individual is unable to do even the most "basic" activities such as walking, eating, bathing, dressing, transferring to a bed, or toileting. Fine motor skills are lost. It is difficult to think clearly.   Incapacitating pain 9 Pain becomes incapacitating. Thought processing is no longer possible. Difficult to remember your own name. Control of movement and coordination are lost.   The worst pain imaginable 10 At this level, most patients pass out from pain. When this level is reached, collapse of the autonomic nervous system occurs, leading to a sudden drop in blood pressure and heart rate. This in turn results in a temporary and dramatic drop in blood flow to the brain, leading to a loss of consciousness. Fainting is one of the body's self defense mechanisms. Passing out puts the brain in a calmed state and causes it to shut down for a while, in order to begin the healing process.    Summary: 1. Refer to this scale when providing [place] with your pain level. 2. Be accurate and careful when reporting your pain level. This will help with your care. 3. Over-reporting your pain level will  lead to loss of credibility. 4. Even a level of 1/10 means that there is pain and will be treated at our facility. 5. High, inaccurate reporting will be documented as "Symptom Exaggeration", leading to loss of credibility and suspicions of possible secondary gains such as obtaining more narcotics, or wanting to appear disabled, for fraudulent reasons. 6. Only pain levels of 5 or below will be seen at our facility. 7. Pain levels of 6 and above will be sent to the Emergency Department and the appointment cancelled. ______________________________________________________________________________________________   ____________________________________________________________________________________________  Preparing for Procedure with Sedation  Instructions: . Oral Intake: Do not eat or drink anything for at least 8 hours prior to your procedure. . Transportation: Public transportation is not allowed. Bring an adult driver. The driver must be physically present in our waiting room before any procedure can be started. Marland Kitchen Physical Assistance: Bring an adult physically capable of assisting you, in the event you need help. This adult should keep you company at home for at least 6 hours after the procedure. . Blood Pressure Medicine: Take your blood pressure medicine with a sip of water the morning of the procedure. . Blood thinners: Notify our staff if you are taking any blood thinners. Depending on which one you take, there will be  specific instructions on how and when to stop it. . Diabetics on insulin: Notify the staff so that you can be scheduled 1st case in the morning. If your diabetes requires high dose insulin, take only  of your normal insulin dose the morning of the procedure and notify the staff that you have done so. . Preventing infections: Shower with an antibacterial soap the morning of your procedure. . Build-up your immune system: Take 1000 mg of Vitamin C with every meal (3 times a day)  the day prior to your procedure. Marland Kitchen Antibiotics: Inform the staff if you have a condition or reason that requires you to take antibiotics before dental procedures. . Pregnancy: If you are pregnant, call and cancel the procedure. . Sickness: If you have a cold, fever, or any active infections, call and cancel the procedure. . Arrival: You must be in the facility at least 30 minutes prior to your scheduled procedure. . Children: Do not bring children with you. . Dress appropriately: Bring dark clothing that you would not mind if they get stained. . Valuables: Do not bring any jewelry or valuables.  Procedure appointments are reserved for interventional treatments only. Marland Kitchen No Prescription Refills. . No medication changes will be discussed during procedure appointments. . No disability issues will be discussed.  Reasons to call and reschedule or cancel your procedure: (Following these recommendations will minimize the risk of a serious complication.) . Surgeries: Avoid having procedures within 2 weeks of any surgery. (Avoid for 2 weeks before or after any surgery). . Flu Shots: Avoid having procedures within 2 weeks of a flu shots or . (Avoid for 2 weeks before or after immunizations). . Barium: Avoid having a procedure within 7-10 days after having had a radiological study involving the use of radiological contrast. (Myelograms, Barium swallow or enema study). . Heart attacks: Avoid any elective procedures or surgeries for the initial 6 months after a "Myocardial Infarction" (Heart Attack). . Blood thinners: It is imperative that you stop these medications before procedures. Let us know if you if you take any blood thinner.  . Infection: Avoid procedures during or within two weeks of an infection (including chest colds or gastrointestinal problems). Symptoms associated with infections include: Localized redness, fever, chills, night sweats or profuse sweating, burning sensation when voiding, cough,  congestion, stuffiness, runny nose, sore throat, diarrhea, nausea, vomiting, cold or Flu symptoms, recent or current infections. It is specially important if the infection is over the area that we intend to treat. Marland Kitchen Heart and lung problems: Symptoms that may suggest an active cardiopulmonary problem include: cough, chest pain, breathing difficulties or shortness of breath, dizziness, ankle swelling, uncontrolled high or unusually low blood pressure, and/or palpitations. If you are experiencing any of these symptoms, cancel your procedure and contact your primary care physician for an evaluation.  Remember:  Regular Business hours are:  Monday to Thursday 8:00 AM to 4:00 PM  Provider's Schedule: Delano Metz, MD:  Procedure days: Tuesday and Thursday 7:30 AM to 4:00 PM  Edward Jolly, MD:  Procedure days: Monday and Wednesday 7:30 AM to 4:00 PM ____________________________________________________________________________________________   GENERAL RISKS AND COMPLICATIONS  What are the risk, side effects and possible complications? Generally speaking, most procedures are safe.  However, with any procedure there are risks, side effects, and the possibility of complications.  The risks and complications are dependent upon the sites that are lesioned, or the type of nerve block to be performed.  The closer the procedure is to the  spine, the more serious the risks are.  Great care is taken when placing the radio frequency needles, block needles or lesioning probes, but sometimes complications can occur. 1. Infection: Any time there is an injection through the skin, there is a risk of infection.  This is why sterile conditions are used for these blocks.  There are four possible types of infection. 1. Localized skin infection. 2. Central Nervous System Infection-This can be in the form of Meningitis, which can be deadly. 3. Epidural Infections-This can be in the form of an epidural abscess, which  can cause pressure inside of the spine, causing compression of the spinal cord with subsequent paralysis. This would require an emergency surgery to decompress, and there are no guarantees that the patient would recover from the paralysis. 4. Discitis-This is an infection of the intervertebral discs.  It occurs in about 1% of discography procedures.  It is difficult to treat and it may lead to surgery.        2. Pain: the needles have to go through skin and soft tissues, will cause soreness.       3. Damage to internal structures:  The nerves to be lesioned may be near blood vessels or    other nerves which can be potentially damaged.       4. Bleeding: Bleeding is more common if the patient is taking blood thinners such as  aspirin, Coumadin, Ticiid, Plavix, etc., or if he/she have some genetic predisposition  such as hemophilia. Bleeding into the spinal canal can cause compression of the spinal  cord with subsequent paralysis.  This would require an emergency surgery to  decompress and there are no guarantees that the patient would recover from the  paralysis.       5. Pneumothorax:  Puncturing of a lung is a possibility, every time a needle is introduced in  the area of the chest or upper back.  Pneumothorax refers to free air around the  collapsed lung(s), inside of the thoracic cavity (chest cavity).  Another two possible  complications related to a similar event would include: Hemothorax and Chylothorax.   These are variations of the Pneumothorax, where instead of air around the collapsed  lung(s), you may have blood or chyle, respectively.       6. Spinal headaches: They may occur with any procedures in the area of the spine.       7. Persistent CSF (Cerebro-Spinal Fluid) leakage: This is a rare problem, but may occur  with prolonged intrathecal or epidural catheters either due to the formation of a fistulous  track or a dural tear.       8. Nerve damage: By working so close to the spinal cord,  there is always a possibility of  nerve damage, which could be as serious as a permanent spinal cord injury with  paralysis.       9. Death:  Although rare, severe deadly allergic reactions known as "Anaphylactic  reaction" can occur to any of the medications used.      10. Worsening of the symptoms:  We can always make thing worse.  What are the chances of something like this happening? Chances of any of this occuring are extremely low.  By statistics, you have more of a chance of getting killed in a motor vehicle accident: while driving to the hospital than any of the above occurring .  Nevertheless, you should be aware that they are possibilities.  In general, it is similar  to taking a shower.  Everybody knows that you can slip, hit your head and get killed.  Does that mean that you should not shower again?  Nevertheless always keep in mind that statistics do not mean anything if you happen to be on the wrong side of them.  Even if a procedure has a 1 (one) in a 1,000,000 (million) chance of going wrong, it you happen to be that one..Also, keep in mind that by statistics, you have more of a chance of having something go wrong when taking medications.  Who should not have this procedure? If you are on a blood thinning medication (e.g. Coumadin, Plavix, see list of "Blood Thinners"), or if you have an active infection going on, you should not have the procedure.  If you are taking any blood thinners, please inform your physician.  How should I prepare for this procedure?  Do not eat or drink anything at least six hours prior to the procedure.  Bring a driver with you .  It cannot be a taxi.  Come accompanied by an adult that can drive you back, and that is strong enough to help you if your legs get weak or numb from the local anesthetic.  Take all of your medicines the morning of the procedure with just enough water to swallow them.  If you have diabetes, make sure that you are scheduled to have  your procedure done first thing in the morning, whenever possible.  If you have diabetes, take only half of your insulin dose and notify our nurse that you have done so as soon as you arrive at the clinic.  If you are diabetic, but only take blood sugar pills (oral hypoglycemic), then do not take them on the morning of your procedure.  You may take them after you have had the procedure.  Do not take aspirin or any aspirin-containing medications, at least eleven (11) days prior to the procedure.  They may prolong bleeding.  Wear loose fitting clothing that may be easy to take off and that you would not mind if it got stained with Betadine or blood.  Do not wear any jewelry or perfume  Remove any nail coloring.  It will interfere with some of our monitoring equipment.  NOTE: Remember that this is not meant to be interpreted as a complete list of all possible complications.  Unforeseen problems may occur.  BLOOD THINNERS The following drugs contain aspirin or other products, which can cause increased bleeding during surgery and should not be taken for 2 weeks prior to and 1 week after surgery.  If you should need take something for relief of minor pain, you may take acetaminophen which is found in Tylenol,m Datril, Anacin-3 and Panadol. It is not blood thinner. The products listed below are.  Do not take any of the products listed below in addition to any listed on your instruction sheet.  A.P.C or A.P.C with Codeine Codeine Phosphate Capsules #3 Ibuprofen Ridaura  ABC compound Congesprin Imuran rimadil  Advil Cope Indocin Robaxisal  Alka-Seltzer Effervescent Pain Reliever and Antacid Coricidin or Coricidin-D  Indomethacin Rufen  Alka-Seltzer plus Cold Medicine Cosprin Ketoprofen S-A-C Tablets  Anacin Analgesic Tablets or Capsules Coumadin Korlgesic Salflex  Anacin Extra Strength Analgesic tablets or capsules CP-2 Tablets Lanoril Salicylate  Anaprox Cuprimine Capsules Levenox Salocol   Anexsia-D Dalteparin Magan Salsalate  Anodynos Darvon compound Magnesium Salicylate Sine-off  Ansaid Dasin Capsules Magsal Sodium Salicylate  Anturane Depen Capsules Marnal Soma  APF Arthritis  pain formula Dewitt's Pills Measurin Stanback  Argesic Dia-Gesic Meclofenamic Sulfinpyrazone  Arthritis Bayer Timed Release Aspirin Diclofenac Meclomen Sulindac  Arthritis pain formula Anacin Dicumarol Medipren Supac  Analgesic (Safety coated) Arthralgen Diffunasal Mefanamic Suprofen  Arthritis Strength Bufferin Dihydrocodeine Mepro Compound Suprol  Arthropan liquid Dopirydamole Methcarbomol with Aspirin Synalgos  ASA tablets/Enseals Disalcid Micrainin Tagament  Ascriptin Doan's Midol Talwin  Ascriptin A/D Dolene Mobidin Tanderil  Ascriptin Extra Strength Dolobid Moblgesic Ticlid  Ascriptin with Codeine Doloprin or Doloprin with Codeine Momentum Tolectin  Asperbuf Duoprin Mono-gesic Trendar  Aspergum Duradyne Motrin or Motrin IB Triminicin  Aspirin plain, buffered or enteric coated Durasal Myochrisine Trigesic  Aspirin Suppositories Easprin Nalfon Trillsate  Aspirin with Codeine Ecotrin Regular or Extra Strength Naprosyn Uracel  Atromid-S Efficin Naproxen Ursinus  Auranofin Capsules Elmiron Neocylate Vanquish  Axotal Emagrin Norgesic Verin  Azathioprine Empirin or Empirin with Codeine Normiflo Vitamin E  Azolid Emprazil Nuprin Voltaren  Bayer Aspirin plain, buffered or children's or timed BC Tablets or powders Encaprin Orgaran Warfarin Sodium  Buff-a-Comp Enoxaparin Orudis Zorpin  Buff-a-Comp with Codeine Equegesic Os-Cal-Gesic   Buffaprin Excedrin plain, buffered or Extra Strength Oxalid   Bufferin Arthritis Strength Feldene Oxphenbutazone   Bufferin plain or Extra Strength Feldene Capsules Oxycodone with Aspirin   Bufferin with Codeine Fenoprofen Fenoprofen Pabalate or Pabalate-SF   Buffets II Flogesic Panagesic   Buffinol plain or Extra Strength Florinal or Florinal with Codeine  Panwarfarin   Buf-Tabs Flurbiprofen Penicillamine   Butalbital Compound Four-way cold tablets Penicillin   Butazolidin Fragmin Pepto-Bismol   Carbenicillin Geminisyn Percodan   Carna Arthritis Reliever Geopen Persantine   Carprofen Gold's salt Persistin   Chloramphenicol Goody's Phenylbutazone   Chloromycetin Haltrain Piroxlcam   Clmetidine heparin Plaquenil   Cllnoril Hyco-pap Ponstel   Clofibrate Hydroxy chloroquine Propoxyphen         Before stopping any of these medications, be sure to consult the physician who ordered them.  Some, such as Coumadin (Warfarin) are ordered to prevent or treat serious conditions such as "deep thrombosis", "pumonary embolisms", and other heart problems.  The amount of time that you may need off of the medication may also vary with the medication and the reason for which you were taking it.  If you are taking any of these medications, please make sure you notify your pain physician before you undergo any procedures.

## 2018-06-16 NOTE — Progress Notes (Signed)
Safety precautions to be maintained throughout the outpatient stay will include: orient to surroundings, keep bed in low position, maintain call bell within reach at all times, provide assistance with transfer out of bed and ambulation.  

## 2018-06-23 ENCOUNTER — Other Ambulatory Visit: Payer: Self-pay | Admitting: Pain Medicine

## 2018-07-01 ENCOUNTER — Ambulatory Visit: Payer: BLUE CROSS/BLUE SHIELD | Admitting: Pain Medicine

## 2018-07-01 NOTE — Progress Notes (Deleted)
Patient's Name: Thomas Benton  MRN: [id number]  Referring Provider: Marina GoodellFeldpausch, Dale E, MD  DOB: 12/01/1975  PCP: Marina GoodellFeldpausch, Dale E, MD  DOS: 07/01/2018  Note by: Oswaldo DoneFrancisco A Otilia Kareem, MD  Service setting: Ambulatory outpatient  Specialty: Interventional Pain Management  Patient type: Established  Location: [facility] (AMB) Pain Management Facility  Visit type: Interventional Procedure   Primary Reason for Visit: Interventional Pain Management Treatment. CC: No chief complaint on file.  Procedure:          Anesthesia, Analgesia, Anxiolysis:  Type: Palliative Intravenous lidocaine infusion #1 Region: Systemic Level: Upper Extremity IV access Laterality: Please see nurses note.  Type: Moderate (Conscious) Sedation Indication(s): Analgesia, anxiolysis, and seizure premedication Route: Intravenous (IV) IV Access: Secured Sedation: Meaningful verbal contact was maintained at all times during the procedure   Position: Supine   Indications: 1. Chronic pain syndrome   2. Type 2 diabetes mellitus with diabetic neuropathy, without long-term current use of insulin (HCC)   3. Chronic lower extremity pain (Primary Area of Pain) (Bilateral) (R>L)   4. Neuropathic pain    Pain Score: Pre-procedure:  /10 Post-procedure:  /10  Pre-op Assessment:  Thomas Benton is a 43 y.o. (year old), male patient, seen today for interventional treatment. He  has no past surgical history on file. Thomas Benton has a current medication list which includes the following prescription(s): aspirin ec, calcium carbonate, vitamin d3, magnesium, and metformin. His primarily concern today is the No chief complaint on file.  Initial Vital Signs:  Pulse/HCG Rate:    Temp:   Resp:   BP:   SpO2:    BMI: Estimated body mass index is 41.05 kg/m as calculated from the following:   Height as of 06/16/18: 5\' 8"  (1.727 m).   Weight as of 06/16/18: 270 lb (122.5 kg).  Risk Assessment: Allergies: Reviewed. He has no allergies on file.   Allergy Precautions: None required Coagulopathies: Reviewed. None identified.  Blood-thinner therapy: None at this time Active Infection(s): Reviewed. None identified. Thomas Benton is afebrile  Site Confirmation: Thomas Benton was asked to confirm the procedure and laterality before marking the site Procedure checklist: Completed Consent: Before the procedure and under the influence of no sedative(s), amnesic(s), or anxiolytics, the patient was informed of the treatment options, risks and possible complications. To fulfill our ethical and legal obligations, as recommended by the American Medical Association's Code of Ethics, I have informed the patient of my clinical impression; the nature and purpose of the treatment or procedure; the risks, benefits, and possible complications of the intervention; the alternatives, including doing nothing; the risk(s) and benefit(s) of the alternative treatment(s) or procedure(s); and the risk(s) and benefit(s) of doing nothing. The patient was provided information about the general risks and possible complications associated with any invasive procedure. These may include, but are not limited to: failure to achieve desired goals; pain; worsening of initial condition; infections; bleeding; organ or nerve damage; allergic reactions; and death. In addition, the patient was informed of those risks and complications associated to this procedure, such as failure to decrease pain; infection; bleeding; phlebitis; vascular extravasation; tissue necrosis; tenderness at IV access site; skin or nerve damage with subsequent damage to sensory, motor, and/or autonomic systems; worsening of the pain; persistent pain, numbness, and/or weakness of one or several areas of the body; cardiac dysrhythmias; seizure; stroke; and/or death. Furthermore, the patient was informed of those risks and complications associated with the medications used during the procedure. These include, but are not  limited to:  allergic reactions (i.e.: anaphylactic or anaphylactoid reactions); cardiac conduction blockade; poisoning; toxicity; CNS depression; cardiovascular depression and collapse; muscle twitching; tonic-clonic seizures; convulsions; loss of consciousness; coma; respiratory depression; arrest; and/or death. Finally, the patient was informed that Medicine is not an exact science; therefore, there is also the possibility of unforeseen or unpredictable risks and/or possible complications that may result in a catastrophic outcome. The patient indicated having understood very clearly. We have given the patient no guarantees and we have made no promises. Enough time was given to the patient to ask questions, all of which were answered to the patient's satisfaction. Thomas Benton has indicated that he wanted to continue with the procedure. Attestation: I, the ordering provider, attest that I have discussed with the patient the benefits, risks, side-effects, alternatives, likelihood of achieving goals, and potential problems during recovery for the procedure that I have provided informed consent. Date  Time: {CHL [facility]-PAIN TIME CHOICES:[id number]}  Pre-Procedure Preparation:  Monitoring: As per clinic protocol. Respiration, ETCO2, SpO2, BP, heart rate and rhythm monitor placed and checked for adequate function Safety Precautions: Patient was assessed for positional comfort and pressure points before starting the procedure. Time-out: I initiated and conducted the "Time-out" before starting the procedure, as per protocol. The patient was asked to participate by confirming the accuracy of the "Time Out" information. Verification of the correct person, site, and procedure were performed and confirmed by me, the nursing staff, and the patient. "Time-out" conducted as per Joint Commission's Universal Protocol (UP.01.01.01). Time:    Description of Procedure:          Target Area: Intravenous Approach: Intravenous  angiocath approach. Area Prepped: Antecubital Prepping solution: Isopropyl Alcohol (70%) Safety Precautions: Medications properly checked for expiration dates. SDV (single dose vial) medications used.  Dose Calculation: Lidocaine preparation: 2 grams of IV lidocaine in 500 mL of D5W (4 mg/mL) (0.4% Lidocaine) Maximum Lidocaine Dose: 4 mg/kg x 122.5 =  490 mg.  Calculated infusion rate: 490 mg divided by 4 mg/mL = 122.5 mL in 60 minutes (120 mL/hr) Set rate: 120 mL/hr Duration of Infusion: 1 hour  Description of the Procedure: Protocol guidelines were followed. The patient was placed in position. Informed consent was obtained.  He was cautioned to be on the look out for prodromal symptoms of a possible impending seizure, such as: new onset tinnitus; perioral, circumoral, or tongue numbness; or a metallic taste in his mouth, lightheadedness; dizziness; visual or auditory disturbances; disorientation; profound drowsiness; or muscle twitching. An IV was started and the patient's dose calculated as above. The infusion was carried out with a nurse at his side 100% of the time, monitoring his vitals as per protocol. Pre-procedure sedation was started 5-10 minutes before infusion and midazolam was kept at bedside during the entire procedure, along with CPR equipment, as per protocol.  There were no vitals filed for this visit.  Start Time:   hrs. End Time:   hrs. Materials: IV infusion pump Medication(s): IV Lidocaine.  Imaging Guidance:          Type of Imaging Technique: None used Indication(s): N/A Exposure Time: No patient exposure Contrast: None used. Fluoroscopic Guidance: N/A Ultrasound Guidance: N/A Interpretation: N/A  Antibiotic Prophylaxis:   Anti-infectives (From admission, onward)   None     Indication(s): None identified  Post-operative Assessment:  Post-procedure Vital Signs:  Pulse/HCG Rate:    Temp:   Resp:   BP:   SpO2:    EBL: None  Complications: No  immediate post-treatment complications observed  by team, or reported by patient.  Note: The patient tolerated the entire procedure well. A repeat set of vitals were taken after the procedure and the patient was kept under observation following institutional policy, for this type of procedure. Post-procedural neurological assessment was performed, showing return to baseline, prior to discharge. The patient was provided with post-procedure discharge instructions, including a section on how to identify potential problems. Should any problems arise concerning this procedure, the patient was given instructions to immediately contact us, at any time, without hesitation. In any case, we plan to contact the patient by telephone for a follow-up status report regarding this interventional procedure.  Comments:  No additional relevant information.  Plan of Care   Procedure Orders    No procedure(s) ordered today    Medications ordered for procedure: No orders of the defined types were placed in this encounter.  Medications administered: Thomas Benton had no medications administered during this visit.  See the medical record for exact dosing rate.  New Prescriptions   No medications on file   Disposition: Discharge home  Discharge Date & Time: 07/01/2018;   hrs.   Physician-requested Follow-up: No follow-ups on file.  Future Appointments  Date Time Provider Department Center  07/01/2018  8:00 AM Delano Metz, MD [facility] None   Primary Care Physician: Marina Goodell, MD Location: [facility] Facility Note by: Oswaldo Done, MD Date: 07/01/2018; Time: 4:59 AM  Disclaimer:  Medicine is not an Visual merchandiser. The only guarantee in medicine is that nothing is guaranteed. It is important to note that the decision to proceed with this intervention was based on the information collected from the patient. The Data and conclusions were drawn from the patient's  questionnaire, the interview, and the physical examination. Because the information was provided in large part by the patient, it cannot be guaranteed that it has not been purposely or unconsciously manipulated. Every effort has been made to obtain as much relevant data as possible for this evaluation. It is important to note that the conclusions that lead to this procedure are derived in large part from the available data. Always take into account that the treatment will also be dependent on availability of resources and existing treatment guidelines, considered by other Pain Management Practitioners as being common knowledge and practice, at the time of the intervention. For Medico-Legal purposes, it is also important to point out that variation in procedural techniques and pharmacological choices are the acceptable norm. The indications, contraindications, technique, and results of the above procedure should only be interpreted and judged by a Board-Certified Interventional Pain Specialist with extensive familiarity and expertise in the same exact procedure and technique.

## 2019-05-07 ENCOUNTER — Other Ambulatory Visit: Payer: Self-pay

## 2019-05-07 ENCOUNTER — Ambulatory Visit: Payer: BLUE CROSS/BLUE SHIELD | Attending: Internal Medicine

## 2019-05-07 DIAGNOSIS — Z20822 Contact with and (suspected) exposure to covid-19: Secondary | ICD-10-CM

## 2019-05-08 LAB — NOVEL CORONAVIRUS, NAA: SARS-CoV-2, NAA: NOT DETECTED

## 2019-05-11 ENCOUNTER — Telehealth: Payer: Self-pay

## 2019-05-11 NOTE — Telephone Encounter (Signed)
Received call from patient stating he has not received his results and that he was symptomatic at this time. Patient advised to follow up with his PCP. Updated with negative test results.

## 2019-06-01 IMAGING — CR DG HAND COMPLETE 3+V*R*
1 series · 3 of 3 positions shown · non-contrast
Comparison: None

CLINICAL DATA: Chronic hand pain, numbness around little finger

EXAM:
RIGHT HAND - COMPLETE 3+ VIEW

[Series 1: dg hand complete right · 0.14mm/px · 3 of 3 slices shown]
[im 1/3]
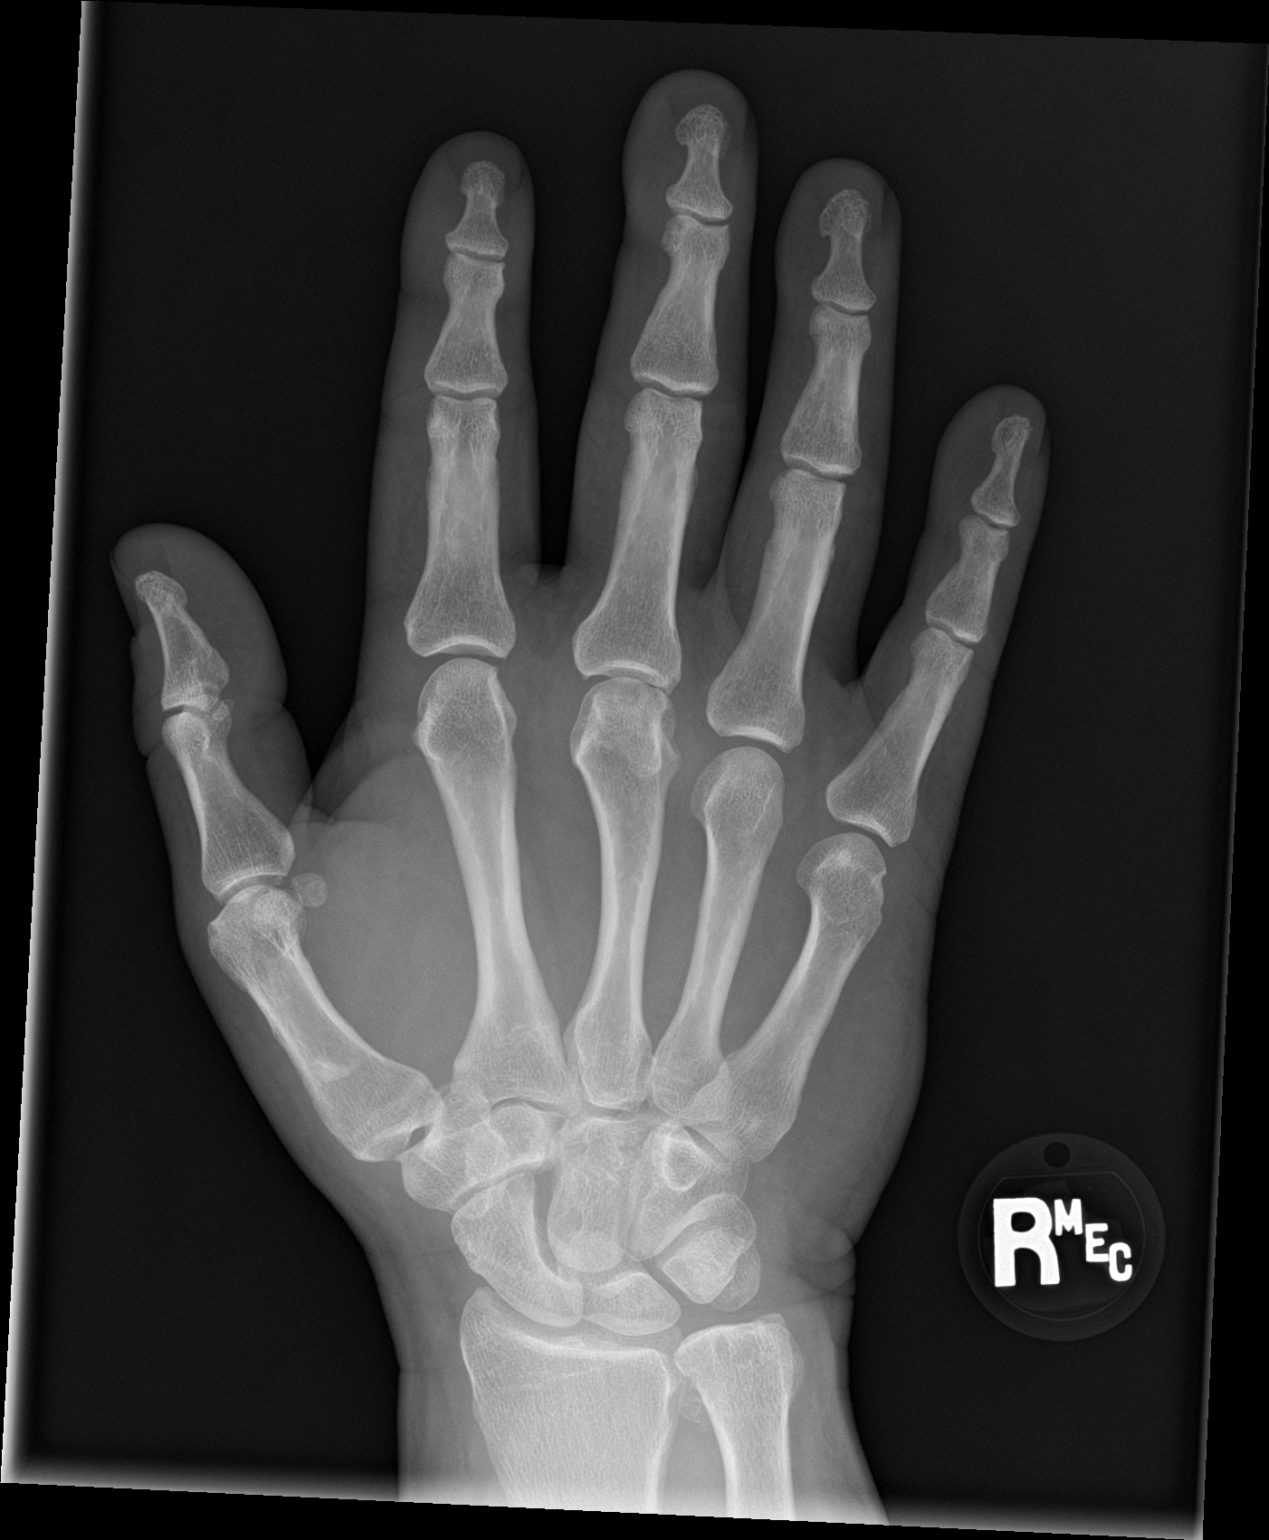
[im 2/3]
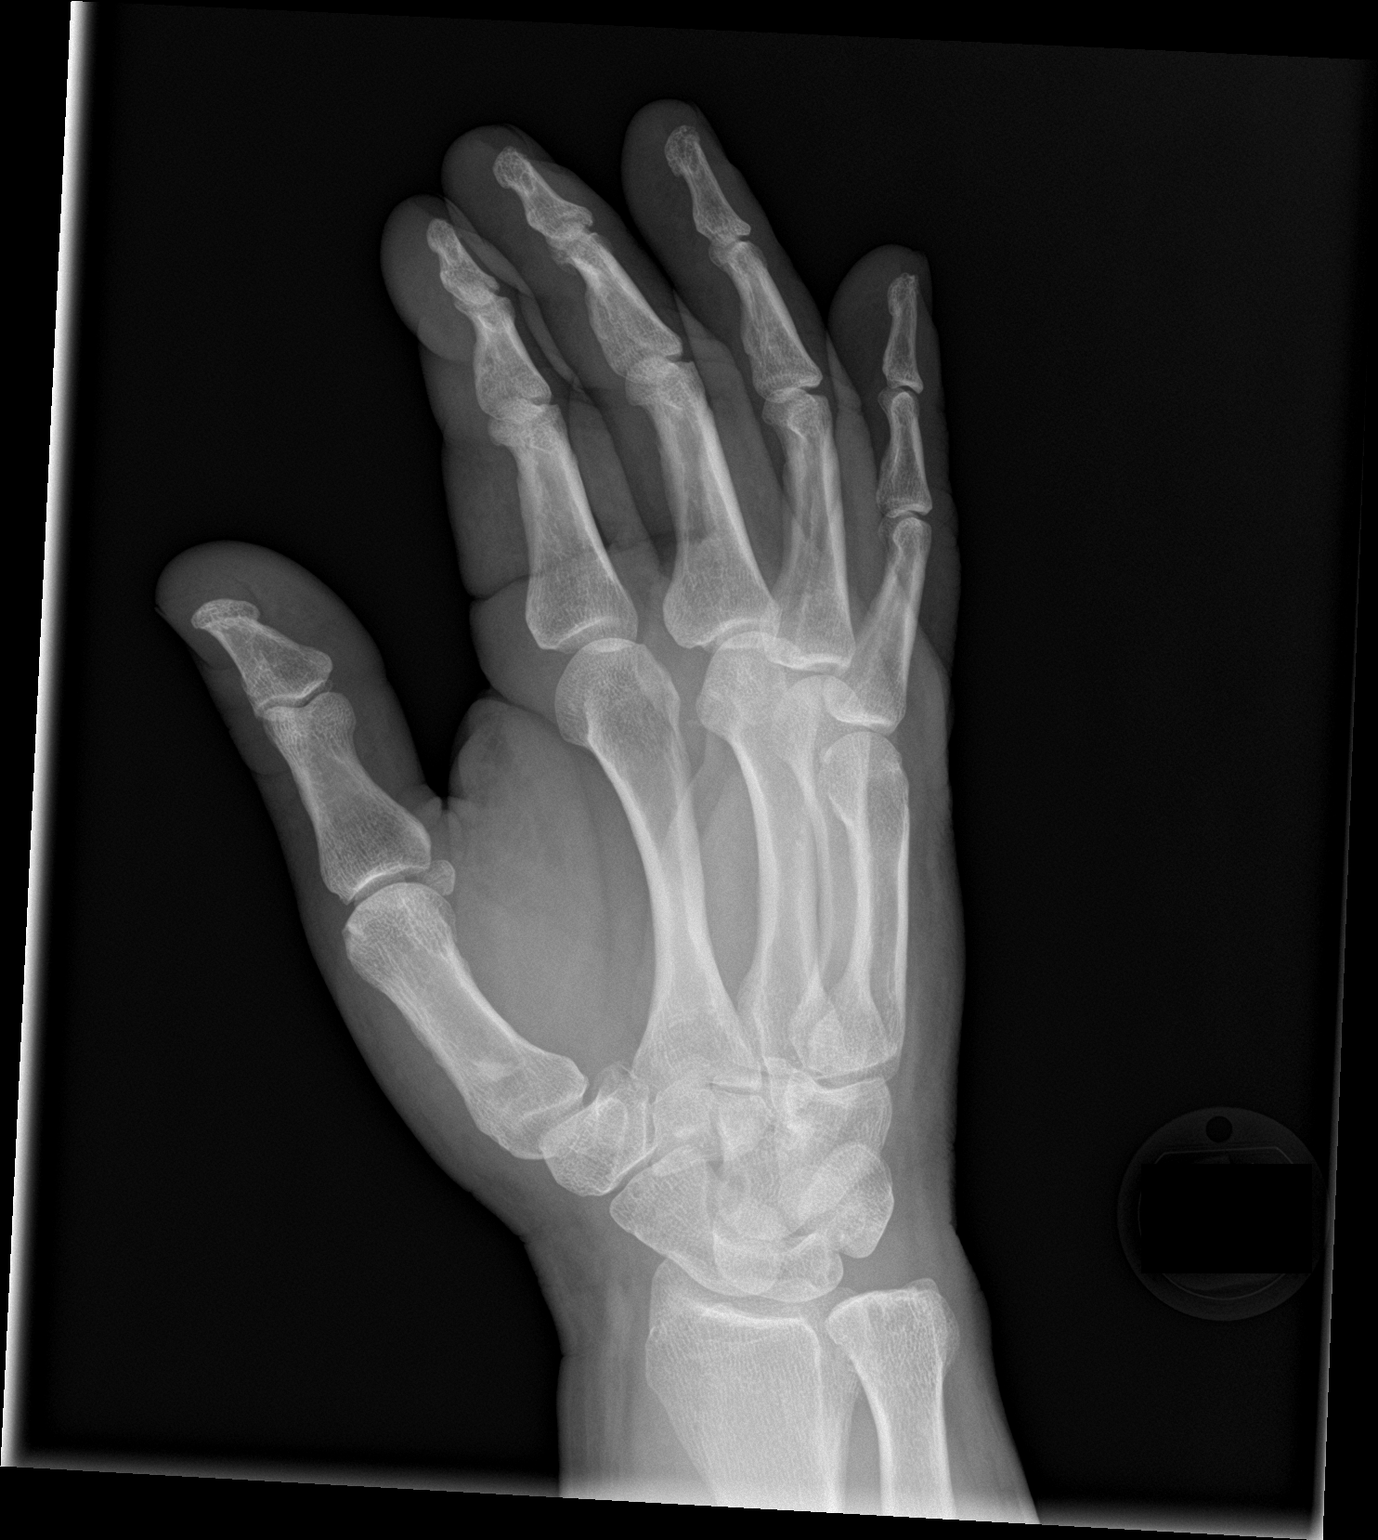
[im 3/3]
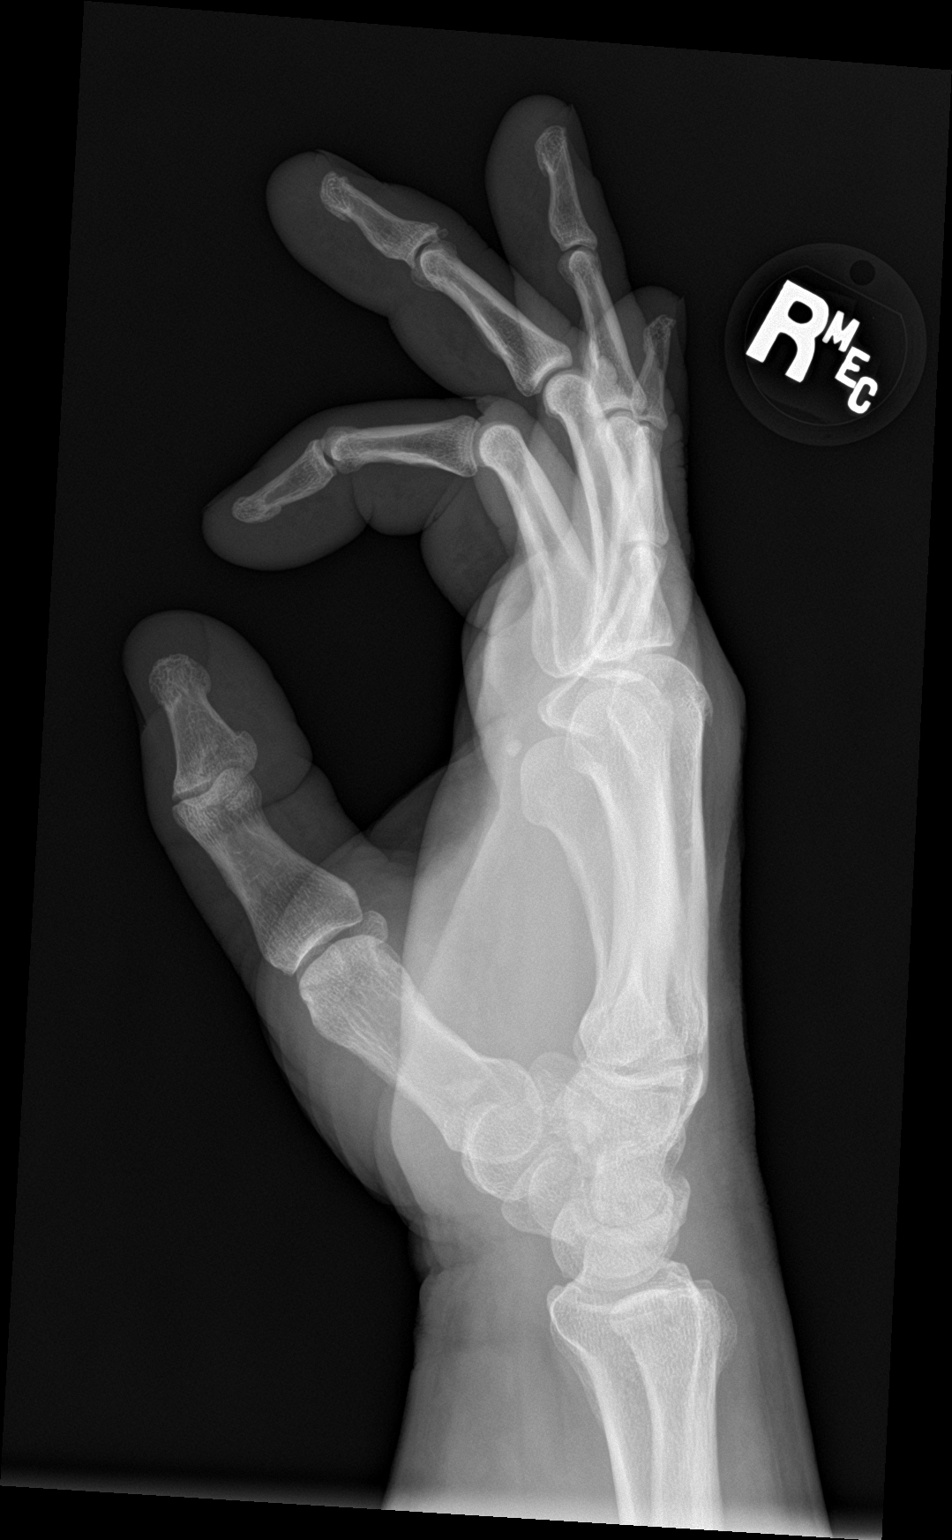

[3 of 3 positions shown; findings below may reference images not displayed]

FINDINGS: Osseous mineralization normal.

Joint spaces preserved.

Probable mild spurring at the distal radioulnar joint.

No acute fracture, dislocation, or bone destruction.
IMPRESSION: No acute osseous abnormalities.

Probable mild degenerative changes of the distal radioulnar joint.
